# Patient Record
Sex: Female | Born: 1960 | Race: Black or African American | Hispanic: No | Marital: Single | Smoking: Never smoker
Health system: Southern US, Community
[De-identification: ages and names within clinical notes are randomized; demographics above are authoritative.]

## PROBLEM LIST (undated history)

## (undated) DIAGNOSIS — F419 Anxiety disorder, unspecified: Secondary | ICD-10-CM

## (undated) DIAGNOSIS — G43909 Migraine, unspecified, not intractable, without status migrainosus: Secondary | ICD-10-CM

## (undated) DIAGNOSIS — Z923 Personal history of irradiation: Secondary | ICD-10-CM

## (undated) DIAGNOSIS — C50212 Malignant neoplasm of upper-inner quadrant of left female breast: Principal | ICD-10-CM

## (undated) DIAGNOSIS — C50919 Malignant neoplasm of unspecified site of unspecified female breast: Secondary | ICD-10-CM

## (undated) HISTORY — PX: NO PAST SURGERIES: SHX2092

## (undated) HISTORY — DX: Anxiety disorder, unspecified: F41.9

## (undated) HISTORY — DX: Malignant neoplasm of upper-inner quadrant of left female breast: C50.212

## (undated) HISTORY — DX: Malignant neoplasm of unspecified site of unspecified female breast: C50.919

## (undated) HISTORY — DX: Migraine, unspecified, not intractable, without status migrainosus: G43.909

---

## 1997-08-22 ENCOUNTER — Other Ambulatory Visit: Admission: RE | Admit: 1997-08-22 | Discharge: 1997-08-22 | Payer: Self-pay | Admitting: Obstetrics and Gynecology

## 1999-10-15 ENCOUNTER — Other Ambulatory Visit: Admission: RE | Admit: 1999-10-15 | Discharge: 1999-10-15 | Payer: Self-pay | Admitting: Obstetrics and Gynecology

## 2001-01-26 ENCOUNTER — Other Ambulatory Visit: Admission: RE | Admit: 2001-01-26 | Discharge: 2001-01-26 | Payer: Self-pay | Admitting: Obstetrics and Gynecology

## 2002-06-28 ENCOUNTER — Other Ambulatory Visit: Admission: RE | Admit: 2002-06-28 | Discharge: 2002-06-28 | Payer: Self-pay | Admitting: Obstetrics & Gynecology

## 2003-07-25 ENCOUNTER — Other Ambulatory Visit: Admission: RE | Admit: 2003-07-25 | Discharge: 2003-07-25 | Payer: Self-pay | Admitting: Obstetrics and Gynecology

## 2004-07-30 ENCOUNTER — Other Ambulatory Visit: Admission: RE | Admit: 2004-07-30 | Discharge: 2004-07-30 | Payer: Self-pay | Admitting: Obstetrics and Gynecology

## 2005-08-19 ENCOUNTER — Other Ambulatory Visit: Admission: RE | Admit: 2005-08-19 | Discharge: 2005-08-19 | Payer: Self-pay | Admitting: Obstetrics and Gynecology

## 2009-03-31 ENCOUNTER — Emergency Department (HOSPITAL_COMMUNITY): Admission: EM | Admit: 2009-03-31 | Discharge: 2009-04-01 | Payer: Self-pay | Admitting: Internal Medicine

## 2014-08-02 ENCOUNTER — Encounter: Payer: Self-pay | Admitting: Internal Medicine

## 2014-08-02 ENCOUNTER — Other Ambulatory Visit (INDEPENDENT_AMBULATORY_CARE_PROVIDER_SITE_OTHER): Payer: BLUE CROSS/BLUE SHIELD

## 2014-08-02 ENCOUNTER — Ambulatory Visit (INDEPENDENT_AMBULATORY_CARE_PROVIDER_SITE_OTHER): Payer: BLUE CROSS/BLUE SHIELD | Admitting: Internal Medicine

## 2014-08-02 VITALS — BP 126/88 | HR 71 | Temp 98.7°F | Resp 12 | Ht 60.0 in | Wt 261.1 lb

## 2014-08-02 DIAGNOSIS — Z0000 Encounter for general adult medical examination without abnormal findings: Secondary | ICD-10-CM

## 2014-08-02 LAB — LIPID PANEL
Cholesterol: 202 mg/dL — ABNORMAL HIGH (ref 0–200)
HDL: 55.8 mg/dL (ref >39.00)
LDL Cholesterol: 129 mg/dL — ABNORMAL HIGH (ref 0–99)
NonHDL: 146.2
Total CHOL/HDL Ratio: 4
Triglycerides: 85 mg/dL (ref 0.0–149.0)
VLDL: 17 mg/dL (ref 0.0–40.0)

## 2014-08-02 LAB — HEMOGLOBIN A1C: Hgb A1c MFr Bld: 5.7 % (ref 4.6–6.5)

## 2014-08-02 LAB — COMPREHENSIVE METABOLIC PANEL
ALT: 12 U/L (ref 0–35)
AST: 16 U/L (ref 0–37)
Albumin: 3.7 g/dL (ref 3.5–5.2)
Alkaline Phosphatase: 63 U/L (ref 39–117)
BUN: 12 mg/dL (ref 6–23)
CO2: 29 mEq/L (ref 19–32)
Calcium: 9.5 mg/dL (ref 8.4–10.5)
Chloride: 106 mEq/L (ref 96–112)
Creatinine, Ser: 0.71 mg/dL (ref 0.40–1.20)
GFR: 110.23 mL/min (ref >60.00)
Glucose, Bld: 76 mg/dL (ref 70–99)
Potassium: 4 mEq/L (ref 3.5–5.1)
Sodium: 138 mEq/L (ref 135–145)
Total Bilirubin: 0.3 mg/dL (ref 0.2–1.2)
Total Protein: 7.2 g/dL (ref 6.0–8.3)

## 2014-08-02 LAB — CBC
HCT: 41.9 % (ref 36.0–46.0)
Hemoglobin: 13.8 g/dL (ref 12.0–15.0)
MCHC: 32.9 g/dL (ref 30.0–36.0)
MCV: 87.3 fl (ref 78.0–100.0)
Platelets: 375 10*3/uL (ref 150.0–400.0)
RBC: 4.8 Mil/uL (ref 3.87–5.11)
RDW: 14.1 % (ref 11.5–15.5)
WBC: 5 10*3/uL (ref 4.0–10.5)

## 2014-08-02 NOTE — Progress Notes (Signed)
Pre visit review using our clinic review tool, if applicable. No additional management support is needed unless otherwise documented below in the visit note. 

## 2014-08-02 NOTE — Patient Instructions (Signed)
We will check the blood work today and call you back with the results.   We will send a message upstairs to the GI office to help get you in with the colon cancer screening.   Work on exercising and eating healthy for you to lose some weight. This is something that will keep you healthy and feeling good for years to come.   Health Maintenance Adopting a healthy lifestyle and getting preventive care can go a long way to promote health and wellness. Talk with your health care provider about what schedule of regular examinations is right for you. This is a good chance for you to check in with your provider about disease prevention and staying healthy. In between checkups, there are plenty of things you can do on your own. Experts have done a lot of research about which lifestyle changes and preventive measures are most likely to keep you healthy. Ask your health care provider for more information. WEIGHT AND DIET  Eat a healthy diet  Be sure to include plenty of vegetables, fruits, low-fat dairy products, and lean protein.  Do not eat a lot of foods high in solid fats, added sugars, or salt.  Get regular exercise. This is one of the most important things you can do for your health.  Most adults should exercise for at least 150 minutes each week. The exercise should increase your heart rate and make you sweat (moderate-intensity exercise).  Most adults should also do strengthening exercises at least twice a week. This is in addition to the moderate-intensity exercise.  Maintain a healthy weight  Body mass index (BMI) is a measurement that can be used to identify possible weight problems. It estimates body fat based on height and weight. Your health care provider can help determine your BMI and help you achieve or maintain a healthy weight.  For females 56 years of age and older:   A BMI below 18.5 is considered underweight.  A BMI of 18.5 to 24.9 is normal.  A BMI of 25 to 29.9 is  considered overweight.  A BMI of 30 and above is considered obese.  Watch levels of cholesterol and blood lipids  You should start having your blood tested for lipids and cholesterol at 54 years of age, then have this test every 5 years.  You may need to have your cholesterol levels checked more often if:  Your lipid or cholesterol levels are high.  You are older than 54 years of age.  You are at high risk for heart disease.  CANCER SCREENING   Lung Cancer  Lung cancer screening is recommended for adults 57-33 years old who are at high risk for lung cancer because of a history of smoking.  A yearly low-dose CT scan of the lungs is recommended for people who:  Currently smoke.  Have quit within the past 15 years.  Have at least a 30-pack-year history of smoking. A pack year is smoking an average of one pack of cigarettes a day for 1 year.  Yearly screening should continue until it has been 15 years since you quit.  Yearly screening should stop if you develop a health problem that would prevent you from having lung cancer treatment.  Breast Cancer  Practice breast self-awareness. This means understanding how your breasts normally appear and feel.  It also means doing regular breast self-exams. Let your health care provider know about any changes, no matter how small.  If you are in your 20s or 30s,  you should have a clinical breast exam (CBE) by a health care provider every 1-3 years as part of a regular health exam.  If you are 26 or older, have a CBE every year. Also consider having a breast X-ray (mammogram) every year.  If you have a family history of breast cancer, talk to your health care provider about genetic screening.  If you are at high risk for breast cancer, talk to your health care provider about having an MRI and a mammogram every year.  Breast cancer gene (BRCA) assessment is recommended for women who have family members with BRCA-related cancers.  BRCA-related cancers include:  Breast.  Ovarian.  Tubal.  Peritoneal cancers.  Results of the assessment will determine the need for genetic counseling and BRCA1 and BRCA2 testing. Cervical Cancer Routine pelvic examinations to screen for cervical cancer are no longer recommended for nonpregnant women who are considered low risk for cancer of the pelvic organs (ovaries, uterus, and vagina) and who do not have symptoms. A pelvic examination may be necessary if you have symptoms including those associated with pelvic infections. Ask your health care provider if a screening pelvic exam is right for you.   The Pap test is the screening test for cervical cancer for women who are considered at risk.  If you had a hysterectomy for a problem that was not cancer or a condition that could lead to cancer, then you no longer need Pap tests.  If you are older than 65 years, and you have had normal Pap tests for the past 10 years, you no longer need to have Pap tests.  If you have had past treatment for cervical cancer or a condition that could lead to cancer, you need Pap tests and screening for cancer for at least 20 years after your treatment.  If you no longer get a Pap test, assess your risk factors if they change (such as having a new sexual partner). This can affect whether you should start being screened again.  Some women have medical problems that increase their chance of getting cervical cancer. If this is the case for you, your health care provider may recommend more frequent screening and Pap tests.  The human papillomavirus (HPV) test is another test that may be used for cervical cancer screening. The HPV test looks for the virus that can cause cell changes in the cervix. The cells collected during the Pap test can be tested for HPV.  The HPV test can be used to screen women 57 years of age and older. Getting tested for HPV can extend the interval between normal Pap tests from three to  five years.  An HPV test also should be used to screen women of any age who have unclear Pap test results.  After 54 years of age, women should have HPV testing as often as Pap tests.  Colorectal Cancer  This type of cancer can be detected and often prevented.  Routine colorectal cancer screening usually begins at 55 years of age and continues through 54 years of age.  Your health care provider may recommend screening at an earlier age if you have risk factors for colon cancer.  Your health care provider may also recommend using home test kits to check for hidden blood in the stool.  A small camera at the end of a tube can be used to examine your colon directly (sigmoidoscopy or colonoscopy). This is done to check for the earliest forms of colorectal cancer.  Routine  screening usually begins at age 87.  Direct examination of the colon should be repeated every 5-10 years through 54 years of age. However, you may need to be screened more often if early forms of precancerous polyps or small growths are found. Skin Cancer  Check your skin from head to toe regularly.  Tell your health care provider about any new moles or changes in moles, especially if there is a change in a mole's shape or color.  Also tell your health care provider if you have a mole that is larger than the size of a pencil eraser.  Always use sunscreen. Apply sunscreen liberally and repeatedly throughout the day.  Protect yourself by wearing long sleeves, pants, a wide-brimmed hat, and sunglasses whenever you are outside. HEART DISEASE, DIABETES, AND HIGH BLOOD PRESSURE   Have your blood pressure checked at least every 1-2 years. High blood pressure causes heart disease and increases the risk of stroke.  If you are between 12 years and 86 years old, ask your health care provider if you should take aspirin to prevent strokes.  Have regular diabetes screenings. This involves taking a blood sample to check your  fasting blood sugar level.  If you are at a normal weight and have a low risk for diabetes, have this test once every three years after 54 years of age.  If you are overweight and have a high risk for diabetes, consider being tested at a younger age or more often. PREVENTING INFECTION  Hepatitis B  If you have a higher risk for hepatitis B, you should be screened for this virus. You are considered at high risk for hepatitis B if:  You were born in a country where hepatitis B is common. Ask your health care provider which countries are considered high risk.  Your parents were born in a high-risk country, and you have not been immunized against hepatitis B (hepatitis B vaccine).  You have HIV or AIDS.  You use needles to inject street drugs.  You live with someone who has hepatitis B.  You have had sex with someone who has hepatitis B.  You get hemodialysis treatment.  You take certain medicines for conditions, including cancer, organ transplantation, and autoimmune conditions. Hepatitis C  Blood testing is recommended for:  Everyone born from [birth year] through 1965.  Anyone with known risk factors for hepatitis C. Sexually transmitted infections (STIs)  You should be screened for sexually transmitted infections (STIs) including gonorrhea and chlamydia if:  You are sexually active and are younger than 54 years of age.  You are older than 54 years of age and your health care provider tells you that you are at risk for this type of infection.  Your sexual activity has changed since you were last screened and you are at an increased risk for chlamydia or gonorrhea. Ask your health care provider if you are at risk.  If you do not have HIV, but are at risk, it may be recommended that you take a prescription medicine daily to prevent HIV infection. This is called pre-exposure prophylaxis (PrEP). You are considered at risk if:  You are sexually active and do not regularly use condoms or  know the HIV status of your partner(s).  You take drugs by injection.  You are sexually active with a partner who has HIV. Talk with your health care provider about whether you are at high risk of being infected with HIV. If you choose to begin PrEP, you should first be  tested for HIV. You should then be tested every 3 months for as long as you are taking PrEP.  PREGNANCY   If you are premenopausal and you may become pregnant, ask your health care provider about preconception counseling.  If you may become pregnant, take 400 to 800 micrograms (mcg) of folic acid every day.  If you want to prevent pregnancy, talk to your health care provider about birth control (contraception). OSTEOPOROSIS AND MENOPAUSE   Osteoporosis is a disease in which the bones lose minerals and strength with aging. This can result in serious bone fractures. Your risk for osteoporosis can be identified using a bone density scan.  If you are 72 years of age or older, or if you are at risk for osteoporosis and fractures, ask your health care provider if you should be screened.  Ask your health care provider whether you should take a calcium or vitamin D supplement to lower your risk for osteoporosis.  Menopause may have certain physical symptoms and risks.  Hormone replacement therapy may reduce some of these symptoms and risks. Talk to your health care provider about whether hormone replacement therapy is right for you.  HOME CARE INSTRUCTIONS   Schedule regular health, dental, and eye exams.  Stay current with your immunizations.   Do not use any tobacco products including cigarettes, chewing tobacco, or electronic cigarettes.  If you are pregnant, do not drink alcohol.  If you are breastfeeding, limit how much and how often you drink alcohol.  Limit alcohol intake to no more than 1 drink per day for nonpregnant women. One drink equals 12 ounces of beer, 5 ounces of wine, or 1 ounces of hard liquor.  Do  not use street drugs.  Do not share needles.  Ask your health care provider for help if you need support or information about quitting drugs.  Tell your health care provider if you often feel depressed.  Tell your health care provider if you have ever been abused or do not feel safe at home. Document Released: 11/04/2010 Document Revised: 09/05/2013 Document Reviewed: 03/23/2013 [facility] Patient Information 2015 [place], [place]. This information is not intended to replace advice given to you by your health care provider. Make sure you discuss any questions you have with your health care provider.

## 2014-08-03 ENCOUNTER — Encounter: Payer: Self-pay | Admitting: Internal Medicine

## 2014-08-03 NOTE — Progress Notes (Signed)
   Subjective:    Patient ID: Maria Allen, female    DOB: November 22, 1960, 54 y.o.   MRN: [id number]  HPI The patient is a new 54 YO female who is coming in for her weight. She has struggled with her for most of her life and has gone down a little bit recently with dieting and more walking. She is not able to do heavy exercise because of her joints but does work to walk regularly. She would like some tips on food. There are weight problems in her family and her mom's side has strong history of diabetes. It has been many years since she saw a doctor and wants to be tested.   PMH, [facility], social history reviewed and updated during visit.   Review of Systems  Constitutional: Negative for fever, activity change, appetite change, fatigue and unexpected weight change.  HENT: Negative.   Eyes: Negative.   Respiratory: Negative for chest tightness, shortness of breath and wheezing.   Cardiovascular: Negative.   Gastrointestinal: Negative.   Musculoskeletal: Negative.   Skin: Negative.   Neurological: Negative.   Psychiatric/Behavioral: Negative.       Objective:   Physical Exam  Constitutional: She is oriented to person, place, and time. She appears well-developed and well-nourished.  HENT:  Head: Normocephalic and atraumatic.  Eyes: EOM are normal.  Neck: Normal range of motion.  Cardiovascular: Normal rate and regular rhythm.   Pulmonary/Chest: Effort normal and breath sounds normal. No respiratory distress. She has no wheezes. She has no rales.  Abdominal: Soft. Bowel sounds are normal. She exhibits no distension. There is no tenderness.  Musculoskeletal: She exhibits no edema.  Neurological: She is alert and oriented to person, place, and time. Coordination normal.  Skin: Skin is warm and dry.   Filed Vitals:   08/02/14 0853  BP: 126/88  Pulse: 71  Temp: 98.7 F (37.1 C)  TempSrc: Oral  Resp: 12  Height: 5' (1.524 m)  Weight: 261 lb 1.9 oz (118.443 kg)  SpO2: 97%      Assessment  & Plan:

## 2014-08-03 NOTE — Assessment & Plan Note (Signed)
She is working on losing weight and we do discuss diet and serving size during our visit. Discussed going to see a nutritionist and she declines but will call back if her diet is not going well. Checking labs today including lipid panel, HgA1c. BP okay.

## 2014-08-04 ENCOUNTER — Telehealth: Payer: Self-pay

## 2014-08-04 NOTE — Telephone Encounter (Signed)
-----   Message from Olga Millers, MD sent at 08/04/2014 11:40 AM EDT ----- Please call and let her know that no signs of diabetes, kidneys and liver are normal as well as her blood counts.

## 2014-08-04 NOTE — Telephone Encounter (Signed)
Left pt a voice mail of dr Donneta Romberg findings, and to call office is she has any further questions

## 2014-09-25 ENCOUNTER — Telehealth: Payer: Self-pay | Admitting: *Deleted

## 2014-09-25 ENCOUNTER — Other Ambulatory Visit: Payer: Self-pay | Admitting: *Deleted

## 2014-09-25 ENCOUNTER — Telehealth: Payer: Self-pay | Admitting: Internal Medicine

## 2014-09-25 DIAGNOSIS — Z1211 Encounter for screening for malignant neoplasm of colon: Secondary | ICD-10-CM

## 2014-09-25 NOTE — Telephone Encounter (Signed)
Patient has now been rescheduled for Monday, 11/13/14 @ 9:30 am [facility] end[facility] with at 8:00 am arrival.

## 2014-09-25 NOTE — Telephone Encounter (Signed)
Patient has been rescheduled to [facility] for colonoscopy. Procedure is scheduled for Tuesday, 10/17/14 @ 10:30 am. She will need to arrive at hospital 9:00 am.

## 2014-09-25 NOTE — Telephone Encounter (Signed)
Called to inform patient that her procedure had to be scheduled at [facility] and had been moved to Tuesday June 14. She is unable to schedule an appointment for any other day than Monday. Dr. Hilarie Fredrickson please advise.

## 2014-09-25 NOTE — Telephone Encounter (Signed)
Maria Allen wants Monday Okay for Monday 1/2 day or Monday of next hospital week assuming MAC available.  If hospital week then 9am or later

## 2014-09-25 NOTE — Telephone Encounter (Signed)
Pt has a BMI of 51, is patient okay for direct hospital colonoscopy or this patient need an office visit before scheduling colonoscopy?

## 2014-09-25 NOTE — Telephone Encounter (Signed)
[name] for direct. I have included Maria Allen please schedule for outpatient 1/2 day session (avoid hospital week if able)

## 2014-09-26 NOTE — Telephone Encounter (Signed)
Returned call to patient, patient initially thought the 11/13/2014 date would not work with her schedule but as of 09/26/2014, she states that 11/13/2014 does work and she plans on making that appointment work.

## 2014-10-03 ENCOUNTER — Ambulatory Visit (AMBULATORY_SURGERY_CENTER): Payer: Self-pay | Admitting: *Deleted

## 2014-10-03 VITALS — Ht 60.0 in | Wt 257.0 lb

## 2014-10-03 DIAGNOSIS — Z1211 Encounter for screening for malignant neoplasm of colon: Secondary | ICD-10-CM

## 2014-10-03 NOTE — Progress Notes (Signed)
No egg or soy allergy No diet pills No home 02 use Never had sedation in the past  emmi video to e mail

## 2014-10-16 ENCOUNTER — Encounter: Payer: BLUE CROSS/BLUE SHIELD | Admitting: Internal Medicine

## 2014-11-02 ENCOUNTER — Encounter (HOSPITAL_COMMUNITY): Payer: Self-pay | Admitting: *Deleted

## 2014-11-03 ENCOUNTER — Telehealth: Payer: Self-pay

## 2014-11-03 NOTE — Telephone Encounter (Signed)
Called patient to see if a recent mammogram has been done. Per patient recent mammogram done through Dr. Cheral Bay with [facility], will call office to obtain report.

## 2014-11-13 ENCOUNTER — Ambulatory Visit (HOSPITAL_COMMUNITY)
Admission: RE | Admit: 2014-11-13 | Discharge: 2014-11-13 | Disposition: A | Discharge status: Home / Self Care | Payer: BLUE CROSS/BLUE SHIELD | Source: Ambulatory Visit | Attending: Internal Medicine | Admitting: Internal Medicine

## 2014-11-13 ENCOUNTER — Encounter (HOSPITAL_COMMUNITY): Payer: Self-pay

## 2014-11-13 ENCOUNTER — Encounter (HOSPITAL_COMMUNITY)
Admission: RE | Disposition: A | Discharge status: Home / Self Care | Payer: Self-pay | Source: Ambulatory Visit | Attending: Internal Medicine

## 2014-11-13 ENCOUNTER — Ambulatory Visit (HOSPITAL_COMMUNITY): Payer: BLUE CROSS/BLUE SHIELD | Admitting: Anesthesiology

## 2014-11-13 DIAGNOSIS — Z6841 Body Mass Index (BMI) 40.0 and over, adult: Secondary | ICD-10-CM | POA: Diagnosis not present

## 2014-11-13 DIAGNOSIS — Z1211 Encounter for screening for malignant neoplasm of colon: Secondary | ICD-10-CM | POA: Insufficient documentation

## 2014-11-13 HISTORY — PX: COLONOSCOPY WITH PROPOFOL: SHX5780

## 2014-11-13 SURGERY — COLONOSCOPY WITH PROPOFOL
Anesthesia: Monitor Anesthesia Care

## 2014-11-13 MED ORDER — PROPOFOL 10 MG/ML IV BOLUS
INTRAVENOUS | Status: AC
  Filled 2014-11-13: qty 20

## 2014-11-13 MED ORDER — SODIUM CHLORIDE 0.9 % IV SOLN: INTRAVENOUS | Status: DC

## 2014-11-13 MED ORDER — PROPOFOL INFUSION 10 MG/ML OPTIME
INTRAVENOUS | Status: DC | PRN
  Administered 2014-11-13: 300 ug/kg/min via INTRAVENOUS

## 2014-11-13 MED ORDER — LACTATED RINGERS IV SOLN
INTRAVENOUS | Status: DC
  Administered 2014-11-13: 1000 mL via INTRAVENOUS
  Administered 2014-11-13: 09:00:00 via INTRAVENOUS

## 2014-11-13 SURGICAL SUPPLY — 22 items

## 2014-11-13 NOTE — Op Note (Signed)
[facility] [name], [address]   COLONOSCOPY PROCEDURE REPORT  PATIENT: Maria Allen, Maria Allen  MR#: [id number] BIRTHDATE: 09/29/1960 , 85  yrs. old GENDER: female ENDOSCOPIST: Jerene Bears, MD REFERRED WE:[id number] Doug Sou, M.D. PROCEDURE DATE:  11/13/2014 PROCEDURE:   Colonoscopy, screening First Screening Colonoscopy - Avg.  risk and is 50 yrs.  old or older Yes.  Prior Negative Screening - Now for repeat screening. N/A  History of Adenoma - Now for follow-up colonoscopy & has been > or = to 3 yrs.  N/A  Polyps removed today? No Recommend repeat exam, <10 yrs? No ASA CLASS:   Class III INDICATIONS:Screening for colonic neoplasia and Colorectal Neoplasm Risk Assessment for this procedure is average risk. MEDICATIONS: Monitored anesthesia care and Per Anesthesia  DESCRIPTION OF PROCEDURE:   After the risks benefits and alternatives of the procedure were thoroughly explained, informed consent was obtained.  The digital rectal exam revealed skin tag and no masses.   The Pentax adult colonoscope was introduced through the anus and advanced to the cecum, which was identified by both the appendix and ileocecal valve. No adverse events experienced.   The quality of the prep was good.  (Suprep was used) The instrument was then slowly withdrawn as the colon was fully examined. Estimated blood loss is zero unless otherwise noted in this procedure report.      COLON FINDINGS: A normal appearing cecum, ileocecal valve, and appendiceal orifice were identified.  the ascending, transverse, descending, sigmoid colon, and rectum appeared unremarkable. Retroflexed views revealed no abnormalities. The time to cecum = 2.6 Withdrawal time = 9.2   The scope was withdrawn and the procedure completed.  COMPLICATIONS: There were no complications.  ENDOSCOPIC IMPRESSION: Normal colonoscopy  RECOMMENDATIONS: You should continue to follow colorectal cancer screening  guidelines for "routine risk" patients with a repeat colonoscopy in 10 years. There is no need for FOBT (stool) testing for at least 5 years.  eSigned:  Jerene Bears, MD 11/13/2014 10:12 AM   cc:  the patient, Dr. Doug Sou

## 2014-11-13 NOTE — H&P (Signed)
HPI: Maria Allen is a 54 year old female with past medical history of migraine headaches who presents for outpatient screening colonoscopy. She is average risk. Never had colonoscopy before. Bowel habits regular prior to getting the prep. Did have some nausea and vomiting with the prep but feels that stools are clear. Denies abdominal pain, blood in stool and melena. No family history of colon cancer  Past Medical History  Diagnosis Date  . Migraine     Past Surgical History  Procedure Laterality Date  . No past surgeries       (Not in an outpatient encounter)  No Known Allergies  Family History  Problem Relation Age of Onset  . Diabetes Mother   . Alcohol abuse Father   . Stroke Father   . Mental illness Father   . Colon cancer Neg Hx   . Colon polyps Neg Hx   . Rectal cancer Neg Hx   . Stomach cancer Neg Hx     History  Substance Use Topics  . Smoking status: Never Smoker   . Smokeless tobacco: Never Used  . Alcohol Use: No    ROS: As per history of present illness, otherwise negative  BP 140/84 mmHg  Pulse 72  Temp(Src) 98.4 F (36.9 C) (Oral)  Resp 18  Ht 5' (1.524 m)  SpO2 98% Constitutional: Well-developed and well-nourished. No distress. HEENT: Normocephalic and atraumatic. Conjunctivae are normal.  No scleral icterus. Neck: Neck supple. Trachea midline. Cardiovascular: Normal rate, regular rhythm and intact distal pulses. No M/R/G Pulmonary/chest: Effort normal and breath sounds normal. No wheezing, rales or rhonchi. Abdominal: Soft, nontender, nondistended. Bowel sounds active throughout. Obese. Extremities: no clubbing, cyanosis, or edema Neurological: Alert and oriented to person place and time. Psychiatric: Normal mood and affect. Behavior is normal.  RELEVANT LABS AND IMAGING: CBC    Component Value Date/Time   WBC 5.0 08/02/2014 0929   RBC 4.80 08/02/2014 0929   HGB 13.8 08/02/2014 0929   HCT 41.9 08/02/2014 0929   PLT 375.0 08/02/2014  0929   MCV 87.3 08/02/2014 0929   MCHC 32.9 08/02/2014 0929   RDW 14.1 08/02/2014 0929    CMP     Component Value Date/Time   NA 138 08/02/2014 0929   K 4.0 08/02/2014 0929   CL 106 08/02/2014 0929   CO2 29 08/02/2014 0929   GLUCOSE 76 08/02/2014 0929   BUN 12 08/02/2014 0929   CREATININE 0.71 08/02/2014 0929   CALCIUM 9.5 08/02/2014 0929   PROT 7.2 08/02/2014 0929   ALBUMIN 3.7 08/02/2014 0929   AST 16 08/02/2014 0929   ALT 12 08/02/2014 0929   ALKPHOS 63 08/02/2014 0929   BILITOT 0.3 08/02/2014 0929    ASSESSMENT/PLAN: 54 year old female presented for average risk screening colonoscopy  1. CRC screening -- colonoscopy being performed in the outpatient hospital setting due to BMI greater than 50. The nature of the procedure, as well as the risks, benefits, and alternatives were carefully and thoroughly reviewed with the patient. Ample time for discussion and questions allowed. The patient understood, was satisfied, and agreed to proceed.

## 2014-11-13 NOTE — Anesthesia Procedure Notes (Signed)
Procedure Name: MAC Date/Time: 11/13/2014 9:44 AM Performed by: Dione Booze Pre-anesthesia Checklist: Patient identified, Emergency Drugs available, Suction available and Patient being monitored Patient Re-evaluated:Patient Re-evaluated prior to inductionOxygen Delivery Method: Simple face mask Placement Confirmation: positive ETCO2 Dental Injury: Teeth and Oropharynx as per pre-operative assessment

## 2014-11-13 NOTE — Anesthesia Postprocedure Evaluation (Signed)
  Anesthesia Post-op Note  Patient: Maria Allen  Procedure(s) Performed: Procedure(s) (LRB): COLONOSCOPY WITH PROPOFOL (N/A)  Patient Location: PACU  Anesthesia Type: MAC  Level of Consciousness: awake and alert   Airway and Oxygen Therapy: Patient Spontanous Breathing  Post-op Pain: mild  Post-op Assessment: Post-op Vital signs reviewed, Patient's Cardiovascular Status Stable, Respiratory Function Stable, Patent Airway and No signs of Nausea or vomiting  Last Vitals:  Filed Vitals:   11/13/14 1040  BP: 124/79  Pulse: 71  Temp:   Resp: 10    Post-op Vital Signs: stable   Complications: No apparent anesthesia complications

## 2014-11-13 NOTE — Transfer of Care (Signed)
Immediate Anesthesia Transfer of Care Note  Patient: Maria Allen  Procedure(s) Performed: Procedure(s): COLONOSCOPY WITH PROPOFOL (N/A)  Patient Location: PACU and Endoscopy Unit  Anesthesia Type:MAC  Level of Consciousness: sedated and patient cooperative  Airway & Oxygen Therapy: Patient Spontanous Breathing and Patient connected to face mask oxygen  Post-op Assessment: Report given to RN and Post -op Vital signs reviewed and stable  Post vital signs: Reviewed and stable  Last Vitals:  Filed Vitals:   11/13/14 0903  BP: 140/84  Pulse: 72  Temp: 36.9 C  Resp: 18    Complications: No apparent anesthesia complications

## 2014-11-13 NOTE — Discharge Instructions (Signed)

## 2014-11-13 NOTE — Anesthesia Preprocedure Evaluation (Signed)
Anesthesia Evaluation  Patient identified by MRN, date of birth, ID band Patient awake    Reviewed: Allergy & Precautions, NPO status , Patient's Chart, lab work & pertinent test results  Airway Mallampati: II  TM Distance: >3 FB Neck ROM: Full    Dental no notable dental hx.    Pulmonary neg pulmonary ROS,  breath sounds clear to auscultation  Pulmonary exam normal       Cardiovascular negative cardio ROS Normal cardiovascular examRhythm:Regular Rate:Normal     Neuro/Psych negative neurological ROS  negative psych ROS   GI/Hepatic negative GI ROS, Neg liver ROS,   Endo/Other  Morbid obesity  Renal/GU negative Renal ROS  negative genitourinary   Musculoskeletal negative musculoskeletal ROS (+)   Abdominal   Peds negative pediatric ROS (+)  Hematology negative hematology ROS (+)   Anesthesia Other Findings   Reproductive/Obstetrics negative OB ROS                             Anesthesia Physical Anesthesia Plan  ASA: III  Anesthesia Plan: MAC   Post-op Pain Management:    Induction:   Airway Management Planned: Natural Airway and Simple Face Mask  Additional Equipment:   Intra-op Plan:   Post-operative Plan:   Informed Consent: I have reviewed the patients History and Physical, chart, labs and discussed the procedure including the risks, benefits and alternatives for the proposed anesthesia with the patient or authorized representative who has indicated his/her understanding and acceptance.   Dental advisory given  Plan Discussed with: CRNA  Anesthesia Plan Comments:         Anesthesia Quick Evaluation

## 2014-11-14 ENCOUNTER — Encounter (HOSPITAL_COMMUNITY): Payer: Self-pay | Admitting: Internal Medicine

## 2015-08-06 ENCOUNTER — Encounter: Payer: BLUE CROSS/BLUE SHIELD | Admitting: Internal Medicine

## 2015-08-10 ENCOUNTER — Other Ambulatory Visit: Payer: Self-pay | Admitting: Obstetrics & Gynecology

## 2015-08-10 DIAGNOSIS — R928 Other abnormal and inconclusive findings on diagnostic imaging of breast: Secondary | ICD-10-CM

## 2015-08-13 ENCOUNTER — Encounter: Payer: Self-pay | Admitting: Internal Medicine

## 2015-08-13 ENCOUNTER — Ambulatory Visit (INDEPENDENT_AMBULATORY_CARE_PROVIDER_SITE_OTHER): Payer: BLUE CROSS/BLUE SHIELD | Admitting: Internal Medicine

## 2015-08-13 VITALS — BP 132/84 | HR 94 | Temp 98.4°F | Resp 12 | Ht 60.0 in | Wt 267.0 lb

## 2015-08-13 DIAGNOSIS — Z0000 Encounter for general adult medical examination without abnormal findings: Secondary | ICD-10-CM

## 2015-08-13 NOTE — Progress Notes (Signed)
   Subjective:    Patient ID: Maria Allen, female    DOB: [birth year], 55 y.o.   MRN: [id number]  HPI The patient is a 55 YO female coming in for wellness. No new concerns, and just had labs at her gynecologist. They started her on phentermine which she started about 1 week ago. Trying to work out in the yard to get back into exercise.   PMH, [facility], social history reviewed and updated.   Review of Systems  Constitutional: Negative for fever, activity change, appetite change, fatigue and unexpected weight change.  HENT: Negative.   Eyes: Negative.   Respiratory: Negative for chest tightness, shortness of breath and wheezing.   Cardiovascular: Negative.   Gastrointestinal: Negative.   Musculoskeletal: Negative.   Skin: Negative.   Neurological: Negative.   Psychiatric/Behavioral: Negative.       Objective:   Physical Exam  Constitutional: She is oriented to person, place, and time. She appears well-developed and well-nourished.  Overweight  HENT:  Head: Normocephalic and atraumatic.  Eyes: EOM are normal.  Neck: Normal range of motion.  Cardiovascular: Normal rate and regular rhythm.   Pulmonary/Chest: Effort normal and breath sounds normal. No respiratory distress. She has no wheezes. She has no rales.  Abdominal: Soft. Bowel sounds are normal. She exhibits no distension. There is no tenderness.  Musculoskeletal: She exhibits no edema.  Neurological: She is alert and oriented to person, place, and time. Coordination normal.  Skin: Skin is warm and dry.   Filed Vitals:   08/13/15 1533  BP: 132/84  Pulse: 94  Temp: 98.4 F (36.9 C)  TempSrc: Oral  Resp: 12  Height: 5' (1.524 m)  Weight: 267 lb (121.11 kg)  SpO2: 97%      Assessment & Plan:

## 2015-08-13 NOTE — Assessment & Plan Note (Signed)
Will get labs from gynecology, talked to her about exercise for weight loss. She is good about nutrition and does not want to see dietician. Up to date on colonoscopy (due 2026) and mammogram (getting [place] next week for likely cyst).

## 2015-08-13 NOTE — Patient Instructions (Signed)
We do not need to check any blood work today.   Keep up the good work with exercise to keep you healthy.   Exercising to lose weight we recommend 1 hour a day 6 days per week.   Health Maintenance, Female Adopting a healthy lifestyle and getting preventive care can go a long way to promote health and wellness. Talk with your health care provider about what schedule of regular examinations is right for you. This is a good chance for you to check in with your provider about disease prevention and staying healthy. In between checkups, there are plenty of things you can do on your own. Experts have done a lot of research about which lifestyle changes and preventive measures are most likely to keep you healthy. Ask your health care provider for more information. WEIGHT AND DIET  Eat a healthy diet  Be sure to include plenty of vegetables, fruits, low-fat dairy products, and lean protein.  Do not eat a lot of foods high in solid fats, added sugars, or salt.  Get regular exercise. This is one of the most important things you can do for your health.  Most adults should exercise for at least 150 minutes each week. The exercise should increase your heart rate and make you sweat (moderate-intensity exercise).  Most adults should also do strengthening exercises at least twice a week. This is in addition to the moderate-intensity exercise.  Maintain a healthy weight  Body mass index (BMI) is a measurement that can be used to identify possible weight problems. It estimates body fat based on height and weight. Your health care provider can help determine your BMI and help you achieve or maintain a healthy weight.  For females 31 years of age and older:   A BMI below 18.5 is considered underweight.  A BMI of 18.5 to 24.9 is normal.  A BMI of 25 to 29.9 is considered overweight.  A BMI of 30 and above is considered obese.  Watch levels of cholesterol and blood lipids  You should start having  your blood tested for lipids and cholesterol at 55 years of age, then have this test every 5 years.  You may need to have your cholesterol levels checked more often if:  Your lipid or cholesterol levels are high.  You are older than 55 years of age.  You are at high risk for heart disease.  CANCER SCREENING   Lung Cancer  Lung cancer screening is recommended for adults 53-34 years old who are at high risk for lung cancer because of a history of smoking.  A yearly low-dose CT scan of the lungs is recommended for people who:  Currently smoke.  Have quit within the past 15 years.  Have at least a 30-pack-year history of smoking. A pack year is smoking an average of one pack of cigarettes a day for 1 year.  Yearly screening should continue until it has been 15 years since you quit.  Yearly screening should stop if you develop a health problem that would prevent you from having lung cancer treatment.  Breast Cancer  Practice breast self-awareness. This means understanding how your breasts normally appear and feel.  It also means doing regular breast self-exams. Let your health care provider know about any changes, no matter how small.  If you are in your 20s or 30s, you should have a clinical breast exam (CBE) by a health care provider every 1-3 years as part of a regular health exam.  If  you are 40 or older, have a CBE every year. Also consider having a breast X-ray (mammogram) every year.  If you have a family history of breast cancer, talk to your health care provider about genetic screening.  If you are at high risk for breast cancer, talk to your health care provider about having an MRI and a mammogram every year.  Breast cancer gene (BRCA) assessment is recommended for women who have family members with BRCA-related cancers. BRCA-related cancers include:  Breast.  Ovarian.  Tubal.  Peritoneal cancers.  Results of the assessment will determine the need for genetic  counseling and BRCA1 and BRCA2 testing. Cervical Cancer Your health care provider may recommend that you be screened regularly for cancer of the pelvic organs (ovaries, uterus, and vagina). This screening involves a pelvic examination, including checking for microscopic changes to the surface of your cervix (Pap test). You may be encouraged to have this screening done every 3 years, beginning at age 13.  For women ages 32-65, health care providers may recommend pelvic exams and Pap testing every 3 years, or they may recommend the Pap and pelvic exam, combined with testing for human papilloma virus (HPV), every 5 years. Some types of HPV increase your risk of cervical cancer. Testing for HPV may also be done on women of any age with unclear Pap test results.  Other health care providers may not recommend any screening for nonpregnant women who are considered low risk for pelvic cancer and who do not have symptoms. Ask your health care provider if a screening pelvic exam is right for you.  If you have had past treatment for cervical cancer or a condition that could lead to cancer, you need Pap tests and screening for cancer for at least 20 years after your treatment. If Pap tests have been discontinued, your risk factors (such as having a new sexual partner) need to be reassessed to determine if screening should resume. Some women have medical problems that increase the chance of getting cervical cancer. In these cases, your health care provider may recommend more frequent screening and Pap tests. Colorectal Cancer  This type of cancer can be detected and often prevented.  Routine colorectal cancer screening usually begins at 55 years of age and continues through 55 years of age.  Your health care provider may recommend screening at an earlier age if you have risk factors for colon cancer.  Your health care provider may also recommend using home test kits to check for hidden blood in the stool.  A  small camera at the end of a tube can be used to examine your colon directly (sigmoidoscopy or colonoscopy). This is done to check for the earliest forms of colorectal cancer.  Routine screening usually begins at age 75.  Direct examination of the colon should be repeated every 5-10 years through 55 years of age. However, you may need to be screened more often if early forms of precancerous polyps or small growths are found. Skin Cancer  Check your skin from head to toe regularly.  Tell your health care provider about any new moles or changes in moles, especially if there is a change in a mole's shape or color.  Also tell your health care provider if you have a mole that is larger than the size of a pencil eraser.  Always use sunscreen. Apply sunscreen liberally and repeatedly throughout the day.  Protect yourself by wearing long sleeves, pants, a wide-brimmed hat, and sunglasses whenever you are  outside. HEART DISEASE, DIABETES, AND HIGH BLOOD PRESSURE   High blood pressure causes heart disease and increases the risk of stroke. High blood pressure is more likely to develop in:  People who have blood pressure in the high end of the normal range (130-139/85-89 mm Hg).  People who are overweight or obese.  People who are African American.  If you are 15-59 years of age, have your blood pressure checked every 3-5 years. If you are 11 years of age or older, have your blood pressure checked every year. You should have your blood pressure measured twice--once when you are at a hospital or clinic, and once when you are not at a hospital or clinic. Record the average of the two measurements. To check your blood pressure when you are not at a hospital or clinic, you can use:  An automated blood pressure machine at a pharmacy.  A home blood pressure monitor.  If you are between 57 years and 75 years old, ask your health care provider if you should take aspirin to prevent strokes.  Have  regular diabetes screenings. This involves taking a blood sample to check your fasting blood sugar level.  If you are at a normal weight and have a low risk for diabetes, have this test once every three years after 55 years of age.  If you are overweight and have a high risk for diabetes, consider being tested at a younger age or more often. PREVENTING INFECTION  Hepatitis B  If you have a higher risk for hepatitis B, you should be screened for this virus. You are considered at high risk for hepatitis B if:  You were born in a country where hepatitis B is common. Ask your health care provider which countries are considered high risk.  Your parents were born in a high-risk country, and you have not been immunized against hepatitis B (hepatitis B vaccine).  You have HIV or AIDS.  You use needles to inject street drugs.  You live with someone who has hepatitis B.  You have had sex with someone who has hepatitis B.  You get hemodialysis treatment.  You take certain medicines for conditions, including cancer, organ transplantation, and autoimmune conditions. Hepatitis C  Blood testing is recommended for:  Everyone born from [birth year] through 1965.  Anyone with known risk factors for hepatitis C. Sexually transmitted infections (STIs)  You should be screened for sexually transmitted infections (STIs) including gonorrhea and chlamydia if:  You are sexually active and are younger than 55 years of age.  You are older than 55 years of age and your health care provider tells you that you are at risk for this type of infection.  Your sexual activity has changed since you were last screened and you are at an increased risk for chlamydia or gonorrhea. Ask your health care provider if you are at risk.  If you do not have HIV, but are at risk, it may be recommended that you take a prescription medicine daily to prevent HIV infection. This is called pre-exposure prophylaxis (PrEP). You are  considered at risk if:  You are sexually active and do not regularly use condoms or know the HIV status of your partner(s).  You take drugs by injection.  You are sexually active with a partner who has HIV. Talk with your health care provider about whether you are at high risk of being infected with HIV. If you choose to begin PrEP, you should first be tested for HIV.  You should then be tested every 3 months for as long as you are taking PrEP.  PREGNANCY   If you are premenopausal and you may become pregnant, ask your health care provider about preconception counseling.  If you may become pregnant, take 400 to 800 micrograms (mcg) of folic acid every day.  If you want to prevent pregnancy, talk to your health care provider about birth control (contraception). OSTEOPOROSIS AND MENOPAUSE   Osteoporosis is a disease in which the bones lose minerals and strength with aging. This can result in serious bone fractures. Your risk for osteoporosis can be identified using a bone density scan.  If you are 20 years of age or older, or if you are at risk for osteoporosis and fractures, ask your health care provider if you should be screened.  Ask your health care provider whether you should take a calcium or vitamin D supplement to lower your risk for osteoporosis.  Menopause may have certain physical symptoms and risks.  Hormone replacement therapy may reduce some of these symptoms and risks. Talk to your health care provider about whether hormone replacement therapy is right for you.  HOME CARE INSTRUCTIONS   Schedule regular health, dental, and eye exams.  Stay current with your immunizations.   Do not use any tobacco products including cigarettes, chewing tobacco, or electronic cigarettes.  If you are pregnant, do not drink alcohol.  If you are breastfeeding, limit how much and how often you drink alcohol.  Limit alcohol intake to no more than 1 drink per day for nonpregnant women. One  drink equals 12 ounces of beer, 5 ounces of wine, or 1 ounces of hard liquor.  Do not use street drugs.  Do not share needles.  Ask your health care provider for help if you need support or information about quitting drugs.  Tell your health care provider if you often feel depressed.  Tell your health care provider if you have ever been abused or do not feel safe at home.   This information is not intended to replace advice given to you by your health care provider. Make sure you discuss any questions you have with your health care provider.   Document Released: 11/04/2010 Document Revised: 05/12/2014 Document Reviewed: 03/23/2013 Elsevier Interactive Patient Education [employer].

## 2015-08-13 NOTE — Progress Notes (Signed)
Pre visit review using our clinic review tool, if applicable. No additional management support is needed unless otherwise documented below in the visit note. 

## 2015-08-20 ENCOUNTER — Other Ambulatory Visit: Payer: BLUE CROSS/BLUE SHIELD

## 2015-08-27 ENCOUNTER — Ambulatory Visit
Admission: RE | Admit: 2015-08-27 | Discharge: 2015-08-27 | Disposition: A | Discharge status: Home / Self Care | Payer: BLUE CROSS/BLUE SHIELD | Source: Ambulatory Visit | Attending: Obstetrics & Gynecology | Admitting: Obstetrics & Gynecology

## 2015-08-27 ENCOUNTER — Other Ambulatory Visit: Payer: Self-pay | Admitting: Obstetrics & Gynecology

## 2015-08-27 DIAGNOSIS — R928 Other abnormal and inconclusive findings on diagnostic imaging of breast: Secondary | ICD-10-CM

## 2015-08-27 DIAGNOSIS — C50919 Malignant neoplasm of unspecified site of unspecified female breast: Secondary | ICD-10-CM

## 2015-08-27 HISTORY — PX: BREAST BIOPSY: SHX20

## 2015-08-27 HISTORY — DX: Malignant neoplasm of unspecified site of unspecified female breast: C50.919

## 2015-08-29 ENCOUNTER — Telehealth: Payer: Self-pay | Admitting: *Deleted

## 2015-08-29 ENCOUNTER — Encounter: Payer: Self-pay | Admitting: *Deleted

## 2015-08-29 DIAGNOSIS — C50212 Malignant neoplasm of upper-inner quadrant of left female breast: Secondary | ICD-10-CM

## 2015-08-29 HISTORY — DX: Malignant neoplasm of upper-inner quadrant of left female breast: C50.212

## 2015-08-29 NOTE — Telephone Encounter (Signed)
Confirmed [facility] for 09/05/15 at 1215 .  Instructions and contact information given.

## 2015-09-04 ENCOUNTER — Other Ambulatory Visit: Payer: Self-pay | Admitting: General Surgery

## 2015-09-04 DIAGNOSIS — C50912 Malignant neoplasm of unspecified site of left female breast: Secondary | ICD-10-CM

## 2015-09-05 ENCOUNTER — Encounter: Payer: Self-pay | Admitting: Physical Therapy

## 2015-09-05 ENCOUNTER — Ambulatory Visit (HOSPITAL_BASED_OUTPATIENT_CLINIC_OR_DEPARTMENT_OTHER): Payer: BLUE CROSS/BLUE SHIELD | Admitting: Hematology and Oncology

## 2015-09-05 ENCOUNTER — Other Ambulatory Visit: Payer: Self-pay | Admitting: *Deleted

## 2015-09-05 ENCOUNTER — Ambulatory Visit: Payer: BLUE CROSS/BLUE SHIELD | Attending: General Surgery | Admitting: Physical Therapy

## 2015-09-05 ENCOUNTER — Other Ambulatory Visit (HOSPITAL_BASED_OUTPATIENT_CLINIC_OR_DEPARTMENT_OTHER): Payer: BLUE CROSS/BLUE SHIELD

## 2015-09-05 ENCOUNTER — Encounter: Payer: Self-pay | Admitting: Hematology and Oncology

## 2015-09-05 ENCOUNTER — Ambulatory Visit
Admission: RE | Admit: 2015-09-05 | Discharge: 2015-09-05 | Disposition: A | Discharge status: Home / Self Care | Payer: BLUE CROSS/BLUE SHIELD | Source: Ambulatory Visit | Attending: Radiation Oncology | Admitting: Radiation Oncology

## 2015-09-05 VITALS — BP 133/86 | HR 83 | Temp 98.2°F | Resp 18 | Ht 60.0 in | Wt 261.7 lb

## 2015-09-05 DIAGNOSIS — C50212 Malignant neoplasm of upper-inner quadrant of left female breast: Secondary | ICD-10-CM

## 2015-09-05 DIAGNOSIS — R293 Abnormal posture: Secondary | ICD-10-CM | POA: Diagnosis present

## 2015-09-05 LAB — CBC WITH DIFFERENTIAL/PLATELET
BASO%: 0.5 % (ref 0.0–2.0)
Basophils Absolute: 0 10*3/uL (ref 0.0–0.1)
EOS%: 4.6 % (ref 0.0–7.0)
Eosinophils Absolute: 0.3 10*3/uL (ref 0.0–0.5)
HCT: 41.4 % (ref 34.8–46.6)
HGB: 13.8 g/dL (ref 11.6–15.9)
LYMPH%: 45.2 % (ref 14.0–49.7)
MCH: 29.2 pg (ref 25.1–34.0)
MCHC: 33.3 g/dL (ref 31.5–36.0)
MCV: 87.5 fL (ref 79.5–101.0)
MONO#: 0.6 10*3/uL (ref 0.1–0.9)
MONO%: 10.2 % (ref 0.0–14.0)
NEUT#: 2.2 10*3/uL (ref 1.5–6.5)
NEUT%: 39.5 % (ref 38.4–76.8)
Platelets: 325 10*3/uL (ref 145–400)
RBC: 4.73 10*6/uL (ref 3.70–5.45)
RDW: 13.5 % (ref 11.2–14.5)
WBC: 5.5 10*3/uL (ref 3.9–10.3)
lymph#: 2.5 10*3/uL (ref 0.9–3.3)

## 2015-09-05 LAB — COMPREHENSIVE METABOLIC PANEL
ALT: 13 U/L (ref 0–55)
AST: 18 U/L (ref 5–34)
Albumin: 3.9 g/dL (ref 3.5–5.0)
Alkaline Phosphatase: 58 U/L (ref 40–150)
Anion Gap: 8 mEq/L (ref 3–11)
BUN: 16.6 mg/dL (ref 7.0–26.0)
CO2: 26 mEq/L (ref 22–29)
Calcium: 10.2 mg/dL (ref 8.4–10.4)
Chloride: 105 mEq/L (ref 98–109)
Creatinine: 0.9 mg/dL (ref 0.6–1.1)
EGFR: 88 mL/min/{1.73_m2} — ABNORMAL LOW (ref >90)
Glucose: 75 mg/dl (ref 70–140)
Potassium: 4.2 mEq/L (ref 3.5–5.1)
Sodium: 139 mEq/L (ref 136–145)
Total Bilirubin: 0.51 mg/dL (ref 0.20–1.20)
Total Protein: 7.6 g/dL (ref 6.4–8.3)

## 2015-09-05 NOTE — Progress Notes (Signed)
[facility] CONSULT NOTE  Patient Care Team: Hoyt Koch, MD as PCP - General (Internal Medicine)  CHIEF COMPLAINTS/PURPOSE OF CONSULTATION:  Newly diagnosed breast cancer  HISTORY OF PRESENTING ILLNESS:  Maria Allen 55 y.o. female is here because of recent diagnosis of Left breast cancer. Patient had a routine screening mammogram that revealed left breast abnormality posteriorly at 11:00 position it measured 9 x 8 x 5 mm. Axilla was negative. 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 She was presented at the multidisciplinary tumor board and she is here today to discuss the treatment plan. Patient came in by herself. She lives alone never been married did not have children. She has a Financial planner.  I reviewed her records extensively and collaborated the history with the patient.  SUMMARY OF ONCOLOGIC HISTORY:   Breast cancer of upper-inner quadrant of left female breast ([place])   08/27/2015 Initial Diagnosis Left breast biopsy 11:00: Invasive lobular cancer with LCIS, grade 1,ER 80%, PR 95%, 10%, HER-2 Negativeratio 0.97, T1 BN 0 stage I a clinical stage   08/27/2015 Mammogram Screening detected left breast asymmetry posteriorly at 11:00 position 9 x 8 x 5 mm by ultrasound, axilla negative    In terms of breast cancer risk profile:  She menarched at early age of all 87 and went to menopause at age 62  She had 0 pregnancies She has received birth control pills for approximately 20-25 years.  She was never exposed to fertility medications or hormone replacement therapy.  She has no family history of Breast/GYN/GI cancer  MEDICAL HISTORY:  Past Medical History  Diagnosis Date  . Migraine   . Breast cancer of upper-inner quadrant of left female breast ([place]) 08/29/2015  . Breast cancer ([place])   . Anxiety     SURGICAL HISTORY: Past Surgical History  Procedure  Laterality Date  . No past surgeries    . Colonoscopy with propofol N/A 11/13/2014    Procedure: COLONOSCOPY WITH PROPOFOL;  Surgeon: Jerene Bears, MD;  Location: [facility];  Service: Gastroenterology;  Laterality: N/A;    SOCIAL HISTORY: Social History   Social History  . Marital Status: Single    Spouse Name: N/A  . Number of Children: N/A  . Years of Education: N/A   Occupational History  . Not on file.   Social History Main Topics  . Smoking status: Never Smoker   . Smokeless tobacco: Never Used  . Alcohol Use: No  . Drug Use: No  . Sexual Activity: Not on file   Other Topics Concern  . Not on file   Social History Narrative    FAMILY HISTORY: Family History  Problem Relation Age of Onset  . Diabetes Mother   . Alcohol abuse Father   . Stroke Father   . Mental illness Father   . Colon cancer Neg Hx   . Colon polyps Neg Hx   . Rectal cancer Neg Hx   . Stomach cancer Neg Hx   . Breast cancer Paternal Aunt     ALLERGIES:  has No Known Allergies.  MEDICATIONS:  Current Outpatient Prescriptions  Medication Sig Dispense Refill  . phentermine 37.5 MG capsule Take 37.5 mg by mouth every morning.    Marland Kitchen aspirin 325 MG tablet Take 650 mg by mouth daily as needed for moderate pain or headache. Reported on 09/05/2015     No current facility-administered medications  for this visit.    REVIEW OF SYSTEMS:   Constitutional: Denies fevers, chills or abnormal night sweats Eyes: Denies blurriness of vision, double vision or watery eyes Ears, nose, mouth, throat, and face: Denies mucositis or sore throat Respiratory: Denies cough, dyspnea or wheezes Cardiovascular: Denies palpitation, chest discomfort or lower extremity swelling Gastrointestinal:  Denies nausea, heartburn or change in bowel habits Skin: Denies abnormal skin rashes Lymphatics: Denies new lymphadenopathy or easy bruising Neurological:Denies numbness, tingling or new weaknesses Behavioral/Psych: Mood is  stable, no new changes  Breast:  Denies any palpable lumps or discharge All other systems were reviewed with the patient and are negative.  PHYSICAL EXAMINATION: ECOG PERFORMANCE STATUS: 0 - Asymptomatic  Filed Vitals:   09/05/15 1319  BP: 133/86  Pulse: 83  Temp: 98.2 F (36.8 C)  Resp: 18   Filed Weights   09/05/15 1319  Weight: 261 lb 11.2 oz (118.706 kg)    GENERAL:alert, no distress and comfortable SKIN: skin color, texture, turgor are normal, no rashes or significant lesions EYES: normal, conjunctiva are pink and non-injected, sclera clear OROPHARYNX:no exudate, no erythema and lips, buccal mucosa, and tongue normal  NECK: supple, thyroid normal size, non-tender, without nodularity LYMPH:  no palpable lymphadenopathy in the cervical, axillary or inguinal LUNGS: clear to auscultation and percussion with normal breathing effort HEART: regular rate & rhythm and no murmurs and no lower extremity edema ABDOMEN:abdomen soft, non-tender and normal bowel sounds Musculoskeletal:no cyanosis of digits and no clubbing  PSYCH: alert & oriented x 3 with fluent speech NEURO: no focal motor/sensory deficits BREAST: No palpable nodules in breast. No palpable axillary or supraclavicular lymphadenopathy (exam performed in the presence of a chaperone)   LABORATORY DATA:  I have reviewed the data as listed Lab Results  Component Value Date   WBC 5.5 09/05/2015   HGB 13.8 09/05/2015   HCT 41.4 09/05/2015   MCV 87.5 09/05/2015   PLT 325 09/05/2015   Lab Results  Component Value Date   NA 139 09/05/2015   K 4.2 09/05/2015   CL 106 08/02/2014   CO2 26 09/05/2015    RADIOGRAPHIC STUDIES: I have personally reviewed the radiological reports and agreed with the findings in the report.  ASSESSMENT AND PLAN:  Breast cancer of upper-inner quadrant of left female breast ([place]) Left breast biopsy 11:00: Invasive lobular cancer with LCIS, grade 1,ER 80%, PR 95%, 10%, HER-2 Negativeratio  0.97, T1 BN 0 stage I a clinical stage Screening detected left breast asymmetry posteriorly at 11:00 position 9 x 8 x 5 mm by ultrasound, axilla negative  Pathology and radiology counseling:Discussed with the patient, the details of pathology including the type of breast cancer,the clinical staging, the significance of ER, PR and HER-2/neu receptors and the implications for treatment. After reviewing the pathology in detail, we proceeded to discuss the different treatment options between surgery, radiation, chemotherapy, antiestrogen therapies.  Recommendations:breast MRI for further evaluation being invasive lobular cancer 1. Breast conserving surgery followed by 2. Oncotype DX testing to determine if chemotherapy would be of any benefit followed by 3. Adjuvant radiation therapy followed by 4. Adjuvant antiestrogen therapy  Oncotype counseling: I discussed Oncotype DX test. I explained to the patient that this is a 21 gene panel to evaluate patient tumors DNA to calculate recurrence score. This would help determine whether patient has high risk or intermediate risk or low risk breast cancer. She understands that if her tumor was found to be high risk, she would benefit from systemic chemotherapy.  If low risk, no need of chemotherapy. If she was found to be intermediate risk, we would need to evaluate the score as well as other risk factors and determine if an abbreviated chemotherapy may be of benefit.  Return to clinic after surgery to discuss final pathology report and then determine if Oncotype DX testing will need to be sent.   All questions were answered. The patient knows to call the clinic with any problems, questions or concerns.    Rulon Eisenmenger, MD 09/05/2015

## 2015-09-05 NOTE — Progress Notes (Signed)
Radiation Oncology         [phone]) [phone] ________________________________  Name: Maria Allen MRN: [id number]  Date: 09/05/2015  DOB: [birth year]  SW:FUXNATFTD Loni Muse Sharlet Salina, MD  Stark Klein, MD     REFERRING PHYSICIAN: Stark Klein, MD   DIAGNOSIS: The encounter diagnosis was Breast cancer of upper-inner quadrant of left female breast ([place]).   HISTORY OF PRESENT ILLNESS: Maria Allen is a 55 y.o. female who was found to have asymmetry of the left breast on a mammogram on 08/27/15. She underwent an ultrasound as well that revealed a 41m lesion of the left breast posteriorly. Her axilla was negative for adenopathy. 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 [facility] and to meet with Dr. MLisbeth Renshawregarding the role of radiotherapy.    PREVIOUS RADIATION THERAPY: No   PAST MEDICAL HISTORY:  Past Medical History  Diagnosis Date  . Migraine   . [place] 08/[place]  . [place] ([place]   . Anxiety        PAST SURGICAL HISTORY: Past Surgical History  Procedure Laterality Date  . No past surgeries    . Colonoscopy with propofol N/A 11/13/2014    Procedure: COLONOSCOPY WITH PROPOFOL;  Surgeon: JJerene Bears MD;  Location: [facility];  Service: Gastroenterology;  Laterality: N/A;     FAMILY HISTORY:  Family History  Problem Relation Age of Onset  . Diabetes Mother   . Alcohol abuse Father   . Stroke Father   . Mental illness Father   . Colon cancer Neg Hx   . Colon polyps Neg Hx   . Rectal cancer Neg Hx   . Stomach cancer Neg Hx   . Breast cancer Paternal Aunt      SOCIAL HISTORY:  reports that she has never smoked. She has never used smokeless tobacco. She reports that she does not drink alcohol or use illicit drugs. The patient is single, does not have children, and is unmarried. 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   MEDICATIONS:  Current Outpatient Prescriptions  Medication Sig Dispense Refill  . aspirin 325 MG tablet Take 650 mg by mouth daily as needed for moderate pain or headache. Reported on 09/05/2015    . phentermine 37.5 MG capsule Take 37.5 mg by mouth every morning.     No current facility-administered medications for this encounter.     REVIEW OF SYSTEMS: On review of systems, the patient reports that she is doing well overall. She denies any chest pain, shortness of breath, cough, fevers, chills, night sweats, unintended weight changes. She denies any bowel or bladder disturbances, and denies abdominal pain, nausea or vomiting. She denies any new musculoskeletal or joint aches or pains. A complete review of systems is obtained and is otherwise negative.     PHYSICAL EXAM:   Pain scale 0/10 In general this is a well appearing African American female in no acute distress. She is alert and oriented x4 and appropriate throughout the examination. HEENT reveals that the patient is normocephalic, atraumatic. EOMs are intact. PERRLA. Skin is intact without any evidence of gross lesions. Cardiovascular exam reveals a regular rate and rhythm, no clicks rubs or murmurs are auscultated. Chest is clear to auscultation bilaterally. Lymphatic assessment  is performed and does not reveal any adenopathy in the cervical, supraclavicular, axillary, or inguinal chains. The left breast reveals post biopsy site without ecchymosis. No palpable masses are noted. The right breast is intact without palpable abnormalities. No nipple discharge or bleeding is noted. Abdomen has active bowel sounds in all quadrants and is intact. The abdomen is soft, non tender, non distended. Lower extremities are negative for pretibial pitting edema, deep calf tenderness, cyanosis or clubbing.   ECOG = 0  0 - Asymptomatic  (Fully active, able to carry on all predisease activities without restriction)  1 - Symptomatic but completely ambulatory (Restricted in physically strenuous activity but ambulatory and able to carry out work of a light or sedentary nature. For example, light housework, office work)  2 - Symptomatic, <50% in bed during the day (Ambulatory and capable of all self care but unable to carry out any work activities. Up and about more than 50% of waking hours)  3 - Symptomatic, >50% in bed, but not bedbound (Capable of only limited self-care, confined to bed or chair 50% or more of waking hours)  4 - Bedbound (Completely disabled. Cannot carry on any self-care. Totally confined to bed or chair)  5 - Death   Eustace Pen MM, Creech RH, Tormey DC, et al. [phone]). "Toxicity and response criteria of the [facility]". [place] Oncol. 5 (6): 649-55    LABORATORY DATA:  Lab Results  Component Value Date   WBC 5.5 09/05/2015   HGB 13.8 09/05/2015   HCT 41.4 09/05/2015   MCV 87.5 09/05/2015   PLT 325 09/05/2015   Lab Results  Component Value Date   NA 139 09/05/2015   K 4.2 09/05/2015   CL 106 08/02/2014   CO2 26 09/05/2015   Lab Results  Component Value Date   ALT 13 09/05/2015   AST 18 09/05/2015   ALKPHOS 58 09/05/2015   BILITOT 0.51 09/05/2015      RADIOGRAPHY: Mm Digital Diagnostic Unilat L  08/27/2015  CLINICAL DATA:  Status post ultrasound-guided biopsy of a left breast mass earlier today. EXAM: DIAGNOSTIC LEFT MAMMOGRAM POST ULTRASOUND BIOPSY COMPARISON:  Previous exam(s). FINDINGS: Mammographic images were obtained following ultrasound guided biopsy of the left breast mass at the 11 o'clock axis. At the conclusion of the procedure, a ribbon shaped tissue marker was placed at the biopsy site. Biopsy clip is well positioned relative to the original mammographic finding. IMPRESSION: Postprocedure mammogram for clip placement. Biopsy clip well-positioned at the site  of the original mammographic abnormality/distortion. Final Assessment: Post Procedure Mammograms for Marker Placement Electronically Signed   By: Franki Cabot M.D.   On: 08/27/2015 11:42   US Breast Ltd Uni Left Inc Axilla  08/27/2015  CLINICAL DATA:  55 year old female presenting for screening recall of the left breast asymmetry. EXAM: 2D DIGITAL DIAGNOSTIC UNILATERAL LEFT MAMMOGRAM WITH CAD AND ADJUNCT TOMO LEFT BREAST ULTRASOUND COMPARISON:  Previous exam(s). ACR Breast Density Category c: The breast tissue is heterogeneously dense, which may obscure small masses. FINDINGS: In the medial left breast, posterior depth there is a spiculated mass measuring approximately 8 mm. No other suspicious calcifications, masses or areas of distortion are seen in the left breast. Mammographic images were processed with CAD. Physical exam of the upper inner left breast demonstrates no discrete palpable masses. Ultrasound targeted to the left breast at 11 o'clock, 8 cm from the nipple demonstrates a hypoechoic shadowing spiculated mass measuring 9 x 8 x 5 mm. No abnormal  lymph nodes were identified in the left axilla. IMPRESSION: 1. There is a spiculated mass in the left breast at 11 o'clock which is suspicious for malignancy. 2.  No evidence of left axillary lymphadenopathy. RECOMMENDATION: Ultrasound-guided biopsy is recommended for the spiculated left breast mass, and has been added on to the same day interventional schedule. I have discussed the findings and recommendations with the patient. Results were also provided in writing at the conclusion of the visit. If applicable, a reminder letter will be sent to the patient regarding the next appointment. BI-RADS CATEGORY  4: Suspicious. Electronically Signed   By: Ammie Ferrier M.D.   On: 08/27/2015 10:34   Mm Diag Breast Tomo Uni Left  08/27/2015  CLINICAL DATA:  55 year old female presenting for screening recall of the left breast asymmetry. EXAM: 2D DIGITAL  DIAGNOSTIC UNILATERAL LEFT MAMMOGRAM WITH CAD AND ADJUNCT TOMO LEFT BREAST ULTRASOUND COMPARISON:  Previous exam(s). ACR Breast Density Category c: The breast tissue is heterogeneously dense, which may obscure small masses. FINDINGS: In the medial left breast, posterior depth there is a spiculated mass measuring approximately 8 mm. No other suspicious calcifications, masses or areas of distortion are seen in the left breast. Mammographic images were processed with CAD. Physical exam of the upper inner left breast demonstrates no discrete palpable masses. Ultrasound targeted to the left breast at 11 o'clock, 8 cm from the nipple demonstrates a hypoechoic shadowing spiculated mass measuring 9 x 8 x 5 mm. No abnormal lymph nodes were identified in the left axilla. IMPRESSION: 1. There is a spiculated mass in the left breast at 11 o'clock which is suspicious for malignancy. 2.  No evidence of left axillary lymphadenopathy. RECOMMENDATION: Ultrasound-guided biopsy is recommended for the spiculated left breast mass, and has been added on to the same day interventional schedule. I have discussed the findings and recommendations with the patient. Results were also provided in writing at the conclusion of the visit. If applicable, a reminder letter will be sent to the patient regarding the next appointment. BI-RADS CATEGORY  4: Suspicious. Electronically Signed   By: Ammie Ferrier M.D.   On: 08/27/2015 10:34   [place] Lt Breast Bx W Loc Dev 1st Lesion Img Bx Spec US Guide  08/29/2015  ADDENDUM REPORT: 08/29/2015 08:33 ADDENDUM: Pathology revealed GRADE I-II INVASIVE MAMMARY CARCINOMA, MAMMARY CARCINOMA IN SITU of the Left breast at the 11:00 o'clock location. This was found to be concordant by Dr. Franki Cabot. Pathology results were discussed with the patient by telephone. The patient reported doing well after the biopsy with tenderness at the site. Post biopsy instructions and care were reviewed and questions were  answered. The patient was encouraged to call [facility] for any additional concerns. The patient was referred to [facility] on Sep 05, 2015. Pathology results reported by Terie Purser, RN on 08/29/2015. Electronically Signed   By: Franki Cabot M.D.   On: 08/29/2015 08:33  08/29/2015  CLINICAL DATA:  Patient presents for ultrasound-guided core biopsy of a left breast mass. EXAM: ULTRASOUND GUIDED LEFT BREAST CORE NEEDLE BIOPSY COMPARISON:  Previous exams including a diagnostic mammogram and ultrasound performed earlier same day. PROCEDURE: I met with the patient and we discussed the procedure of ultrasound-guided biopsy, including benefits and alternatives. We discussed the high likelihood of a successful procedure. We discussed the risks of the procedure including infection, bleeding, tissue injury, clip migration, and inadequate sampling. Informed written  consent was given. The usual time-out protocol was performed immediately prior to the procedure. Using sterile technique and 1% Lidocaine as local anesthetic, under direct ultrasound visualization, a 12 gauge vacuum-assisted device was used to perform biopsy of the left breast mass at the 11 o'clock axis, 8 cm from the nipple, at posterior depth,using a lateral approach. At the conclusion of the procedure, a ribbon shaped tissue marker clip was deployed into the biopsy cavity. Follow-up 2-view mammogram was performed and dictated separately. IMPRESSION: Ultrasound-guided biopsy of the left breast mass at the 11 o'clock axis. No apparent complications. Electronically Signed: By: Franki Cabot M.D. On: 08/27/2015 11:41       IMPRESSION: Stage I, cT1NoMx ER/PR positive Her-2 neu negative invasive lobular carcinoma with LCIS   PLAN: Dr. Lisbeth Renshaw discusses the pathology findings and reviews the nature of in situ and invasive lobular disease. The consensus from  the breast conference include MRI of the breast conservation with lumpectomy with sentinel node evaluation, oncotype evaluation,  followed by external radiotherapy to the breast provided that there is no role for radiotherapy. Following this, Dr. Lindi Adie would recommend antiestrogen therapy.  Dr. Lisbeth Renshaw discusses the role of radiotherapy in this setting following surgery and discusses that provided she did not need chemotherapy, we would recommend beginning radiotherapy about 4-6 weeks postop. He discusses that he would offer the patient 66 fractions to the left breast. We will discuss this further and consider breath hold technique to decrease risk of exposure to the heart. She states agreement and understanding of this and is interested in moving forward.  The above documentation reflects my direct findings during this shared patient visit. Please see the separate note by Dr. Lisbeth Renshaw on this date for the remainder of the patient's plan of care.    Carola Rhine, PAC

## 2015-09-05 NOTE — Therapy (Signed)
[place], [name], [address] Phone: [phone]   Fax:  [phone]  Physical Therapy Evaluation  Patient Details  Name: Maria Allen MRN: [id number] Date of Birth: Jun 18, 1960 Referring Provider: Dr. Stark Klein  Encounter Date: 09/05/2015      PT End of Session - 09/05/15 1612    Visit Number 1   Number of Visits 1   PT Start Time 3403   PT Stop Time 1446  Also saw pt from 1545-1600 for a total of 29 minutes   PT Time Calculation (min) 14 min   Activity Tolerance Patient tolerated treatment well   Behavior During Therapy [facility] for tasks assessed/performed      Past Medical History  Diagnosis Date  . Migraine   . Breast cancer of upper-inner quadrant of left female breast ([place]) 08/29/2015  . Breast cancer ([place])   . Anxiety     Past Surgical History  Procedure Laterality Date  . No past surgeries    . Colonoscopy with propofol N/A 11/13/2014    Procedure: COLONOSCOPY WITH PROPOFOL;  Surgeon: Jerene Bears, MD;  Location: [facility];  Service: Gastroenterology;  Laterality: N/A;    There were no vitals filed for this visit.       Subjective Assessment - 09/05/15 1602    Subjective Patient reports she is here today to be seen by her multi-disciplinary team for an assessment of her newly diagnosed left breast cancer.   Pertinent History Patient was diagnosed on 08/06/15 with left invasive lobular carcinoma and lobular carcinoma in situ breast cancer in the upper inner quadrant.  It measures 9 mm, is Grade 1, is ER/PR positive, HER2 negative and has a Ki67 of 10%.     Patient Stated Goals Reduce lymphedema risk and learn post op shoulder ROM HEP   Currently in Pain? No/denies            [facility] PT Assessment - 09/05/15 0001    Assessment   Medical Diagnosis Left breast cancer   Referring Provider Dr. Stark Klein   Onset Date/Surgical Date 08/06/15   Hand Dominance Right   Prior Therapy  none   Precautions   Precautions Other (comment)   Precaution Comments Active breast cancer   Restrictions   Weight Bearing Restrictions No   Balance Screen   Has the patient fallen in the past 6 months No   Has the patient had a decrease in activity level because of a fear of falling?  No   Is the patient reluctant to leave their home because of a fear of falling?  No   Home Environment   Living Environment Private residence   Living Arrangements Alone   Available Help at Discharge Friend(s)   Prior Function   Level of Independence Independent   Vocation Full time employment   Vocation Requirements hairdresser   Leisure She gardens frequently at her house   Cognition   Overall Cognitive Status Within [place] for tasks assessed   Posture/Postural Control   Posture/Postural Control Postural limitations   Postural Limitations Rounded Shoulders;Forward head   ROM / Strength   AROM / PROM / Strength AROM;Strength   AROM   AROM Assessment Site Shoulder   Right/Left Shoulder Right;Left   Right Shoulder Extension 44 Degrees   Right Shoulder Flexion 150 Degrees   Right Shoulder ABduction 164 Degrees   Right Shoulder Internal Rotation 59 Degrees   Right Shoulder External Rotation 68 Degrees  Left Shoulder Extension 40 Degrees   Left Shoulder Flexion 145 Degrees   Left Shoulder ABduction 157 Degrees   Left Shoulder Internal Rotation 69 Degrees   Left Shoulder External Rotation 75 Degrees   Strength   Overall Strength Within functional limits for tasks performed           LYMPHEDEMA/ONCOLOGY QUESTIONNAIRE - 09/05/15 1609    Type   Cancer Type Left breast cancer   Lymphedema Assessments   Lymphedema Assessments Upper extremities   Right Upper Extremity Lymphedema   10 cm Proximal to Olecranon Process 38.2 cm   Olecranon Process 27 cm   10 cm Proximal to Ulnar Styloid Process 25.3 cm   Just Proximal to Ulnar Styloid Process 16.2 cm   Across Hand at [employer] 19.8 cm   At [place] of 2nd Digit 6.7 cm   Left Upper Extremity Lymphedema   10 cm Proximal to Olecranon Process 37.8 cm   Olecranon Process 26.7 cm   10 cm Proximal to Ulnar Styloid Process 24.3 cm   Just Proximal to Ulnar Styloid Process 16.5 cm   Across Hand at [employer] 19.3 cm   At [place] of 2nd Digit 6.2 cm      Patient was instructed today in a home exercise program today for post op shoulder range of motion. These included active assist shoulder flexion in sitting, scapular retraction, wall walking with shoulder abduction, and hands behind head external rotation.  She was encouraged to do these twice a day, holding 3 seconds and repeating 5 times when permitted by her physician.         PT Education - 09/05/15 1611    Education provided Yes   Education Details Lymphedema risk reduction and post op shoulder ROM HEP   Person(s) Educated Patient   Methods Explanation;Demonstration;Handout   Comprehension Returned demonstration;Verbalized understanding              Breast Clinic Goals - 09/05/15 1616    Patient will be able to verbalize understanding of pertinent lymphedema risk reduction practices relevant to her diagnosis specifically related to skin care.   Time 1   Period Days   Status Achieved   Patient will be able to return demonstrate and/or verbalize understanding of the post-op home exercise program related to regaining shoulder range of motion.   Time 1   Period Days   Status Achieved   Patient will be able to verbalize understanding of the importance of attending the postoperative After Breast Cancer Class for further lymphedema risk reduction education and therapeutic exercise.   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  It measures 9  mm, is Grade 1, is ER/PR positive, HER2 negative and has a Ki67 of 10%.  She is planning to have a left lumpectomy and sentinel node biopsy followed by Oncotype testing, radiation, and anti-estrogen therapy.  She may benefit from post op PT to regain shoulder ROM and reduce lympehdema risk.   Rehab Potential Excellent   Clinical Impairments Affecting Rehab Potential none   PT Frequency One time visit   PT Treatment/Interventions Therapeutic exercise;Patient/family education   PT Next Visit Plan Will f/u after surgery   PT Home Exercise Plan  Post op shoulder ROM HEP   Consulted and Agree with Plan of Care Patient      Patient will benefit from skilled therapeutic intervention in order to improve the following deficits and impairments:  Decreased strength, Decreased knowledge of precautions, Pain, Impaired UE functional use, Decreased range of motion  Visit Diagnosis: Carcinoma of left breast upper inner quadrant (HCC) - Plan: PT plan of care cert/re-cert  Abnormal posture - Plan: PT plan of care cert/re-cert   Patient will follow up at outpatient cancer rehab if needed following surgery.  If the patient requires physical therapy at that time, a specific plan will be dictated and sent to the referring physician for approval. The patient was educated today on appropriate basic range of motion exercises to begin post operatively and the importance of attending the After Breast Cancer class following surgery.  Patient was educated today on lymphedema risk reduction practices as it pertains to recommendations that will benefit the patient immediately following surgery.  She verbalized good understanding.  No additional physical therapy is indicated at this time.      Problem List Patient Active Problem List   Diagnosis Date Noted  . Breast cancer of upper-inner quadrant of left female breast ([place]) 08/29/2015  . Routine general medical examination at a health care facility 08/13/2015  . Special  screening for malignant neoplasms, colon   . Morbid obesity ([place]) 08/03/2014    Annia Friendly, PT 09/05/2015 4:17 PM  [place], [name], [address] Phone: [phone]   Fax:  [phone]  Name: Maria Allen MRN: [id number] Date of Birth: November 25, 1960

## 2015-09-05 NOTE — Patient Instructions (Signed)
[facility] CLASS After Breast Cancer Class  After Breast Cancer Class is a specially designed exercise class to assist you in a safe recover after having breast cancer surgery.  In this class you will learn how to get back to full function whether your drains were just removed or if you had surgery a month ago.  This one-time class is held the 1st and 3rd Monday of every month from 11:00 a.m. until 12:00 noon at the [facility] located at [place], [place] [address]  This class is FREE and space is limited. For more information or to register for the next available class, call [phone].  Class Goals   Understand specific stretches to improve the flexibility of you chest and shoulder.  Learn ways to safely strengthen your upper body and improve your posture.  Understand the warning signs of infection and why you may be at risk for an arm infection.  Learn about Lymphedema and prevention.  ** You do not attend this class until after surgery.  Drains must be removed to participate  Patient was instructed today in a home exercise program today for post op shoulder range of motion. These included active assist shoulder flexion in sitting, scapular retraction, wall walking with shoulder abduction, and hands behind head external rotation.  She was encouraged to do these twice a day, holding 3 seconds and repeating 5 times when permitted by her physician.

## 2015-09-05 NOTE — Assessment & Plan Note (Signed)
Left breast biopsy 11:00: Invasive lobular cancer with LCIS, grade 1,ER 80%, PR 95%, 10%, HER-2 Negativeratio 0.97, T1 BN 0 stage I a clinical stage Screening detected left breast asymmetry posteriorly at 11:00 position 9 x 8 x 5 mm by ultrasound, axilla negative  Pathology and radiology counseling:Discussed with the patient, the details of pathology including the type of breast cancer,the clinical staging, the significance of ER, PR and HER-2/neu receptors and the implications for treatment. After reviewing the pathology in detail, we proceeded to discuss the different treatment options between surgery, radiation, chemotherapy, antiestrogen therapies.  Recommendations:breast MRI for further evaluation being invasive lobular cancer 1. Breast conserving surgery followed by 2. Oncotype DX testing to determine if chemotherapy would be of any benefit followed by 3. Adjuvant radiation therapy followed by 4. Adjuvant antiestrogen therapy  Oncotype counseling: I discussed Oncotype DX test. I explained to the patient that this is a 21 gene panel to evaluate patient tumors DNA to calculate recurrence score. This would help determine whether patient has high risk or intermediate risk or low risk breast cancer. She understands that if her tumor was found to be high risk, she would benefit from systemic chemotherapy. If low risk, no need of chemotherapy. If she was found to be intermediate risk, we would need to evaluate the score as well as other risk factors and determine if an abbreviated chemotherapy may be of benefit.  Return to clinic after surgery to discuss final pathology report and then determine if Oncotype DX testing will need to be sent.

## 2015-09-05 NOTE — Progress Notes (Signed)
Note created by Dt. Gudena during office visit, copy to patient, original to scan.

## 2015-09-06 ENCOUNTER — Encounter: Payer: Self-pay | Admitting: General Practice

## 2015-09-06 NOTE — Progress Notes (Signed)
[place] Psychosocial Distress Screening Spiritual Care  Maria Allen was seen by counseling intern Vaughan Sine at [facility] to introduce [place] team/resources, reviewing distress screen per protocol.  The patient scored a 7 on the Psychosocial Distress Thermometer which indicates severe distress. Counseling intern also assessed for distress and other psychosocial needs.   ONCBCN DISTRESS SCREENING 09/06/2015  Screening Type Initial Screening  Distress experienced in past week (1-10) 7  Practical problem type Housing;Insurance;Work/school  Emotional problem type Nervousness/Anxiety  Spiritual/Religous concerns type Relating to God  Information Concerns Type Lack of info about diagnosis;Lack of info about treatment;Lack of info about complementary therapy choices;Lack of info about maintaining fitness  Physical Problem type Constipation/diarrhea;Skin dry/itchy  Referral to support programs Yes   Pt states that she normally feels very positive and has a low-stress life, so distress indicated on screen is due to coping with dx; she also reports that receiving further medical info and support from clinic team has reduced her distress to a 4.  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  Two sisters are sources of support.  Gardening is a source of meaning and enjoyment.    Follow up needed: Yes.  Plan to f/u by call or note to offer further support.  Please also page as needs arise.  Thank you.  [place], [place], [facility] Counseling Intern Supervisor Pager [phone] Voicemail [phone]

## 2015-09-10 ENCOUNTER — Telehealth: Payer: Self-pay | Admitting: *Deleted

## 2015-09-10 ENCOUNTER — Inpatient Hospital Stay: Admission: RE | Admit: 2015-09-10 | Payer: BLUE CROSS/BLUE SHIELD | Source: Ambulatory Visit

## 2015-09-10 ENCOUNTER — Ambulatory Visit
Admission: RE | Admit: 2015-09-10 | Discharge: 2015-09-10 | Disposition: A | Discharge status: Home / Self Care | Payer: BLUE CROSS/BLUE SHIELD | Source: Ambulatory Visit | Attending: General Surgery | Admitting: General Surgery

## 2015-09-10 DIAGNOSIS — C50212 Malignant neoplasm of upper-inner quadrant of left female breast: Secondary | ICD-10-CM

## 2015-09-10 MED ORDER — GADOBENATE DIMEGLUMINE 529 MG/ML IV SOLN
20.0000 mL | Freq: Once | INTRAVENOUS | Status: AC | PRN
  Administered 2015-09-10: 20 mL via INTRAVENOUS

## 2015-09-10 NOTE — Telephone Encounter (Signed)
Spoke to pt concerning [place] from 09/05/15. Denies questions or concerns regarding dx or treatment care plan. Encourage pt to call with needs. Received verbal understanding. Contact information provided.

## 2015-09-13 ENCOUNTER — Encounter: Payer: Self-pay | Admitting: *Deleted

## 2015-09-13 ENCOUNTER — Telehealth: Payer: Self-pay | Admitting: Hematology and Oncology

## 2015-09-13 ENCOUNTER — Other Ambulatory Visit: Payer: Self-pay | Admitting: General Surgery

## 2015-09-13 DIAGNOSIS — C50912 Malignant neoplasm of unspecified site of left female breast: Secondary | ICD-10-CM

## 2015-09-13 NOTE — Telephone Encounter (Signed)
s.w. pt and gv appt....pt ok and aware

## 2015-09-26 ENCOUNTER — Encounter: Payer: Self-pay | Admitting: *Deleted

## 2015-09-26 NOTE — Progress Notes (Signed)
[facility] Clinical Social Work  Clinical Social Work met with pt in CSW office to offer support and assess for needs.  CSW reviewed ADRs packet with pt and assistance options. Pt provided pt with assistance applications and pt will review and return next week. Pt aware to reach out as needed.    Clinical Social Work interventions: Resource education Grier Hock, LCSW Clinical Social Worker [facility]  [facility] Phone: [phone] Fax: [phone]   

## 2015-09-27 ENCOUNTER — Encounter: Payer: Self-pay | Admitting: Hematology and Oncology

## 2015-09-27 NOTE — Progress Notes (Signed)
Patient called and states she was referred by SW inquiring about the [employer]. Patient currently not in active treatment but will start with Radiation after her lumpectomy. Advised patient Maria Allen and Maria Allen are the financial advocates for Radiation and she may meet with them to discuss once she starts treatment.

## 2015-10-03 ENCOUNTER — Ambulatory Visit (HOSPITAL_COMMUNITY): Payer: BLUE CROSS/BLUE SHIELD

## 2015-10-03 ENCOUNTER — Other Ambulatory Visit (HOSPITAL_COMMUNITY): Payer: Self-pay | Admitting: *Deleted

## 2015-10-03 NOTE — Pre-Procedure Instructions (Signed)
Maria Allen  10/03/2015      [facility] - [place], [place] - [address] [facility] [place] [address] Phone: [phone] Fax: [phone]    Your procedure is scheduled on Tuesday, October 09, 2015 at 10:50 AM.  Report to [facility] Entrance "A" Admitting Office at 8:45 AM.  Call this number if you have problems the morning of surgery: [phone]  Any questions prior to day of surgery, please call [phone] between 8 & 4 PM.   Remember:  Do not eat food or drink liquids after midnight Monday, 10/08/15.   Stop Aspirin and Phentermine as of today.   Do not wear jewelry, make-up or nail polish.  Do not wear lotions, powders, or perfumes.  You may NOT wear deodorant.  Do not shave 48 hours prior to surgery.   Do not bring valuables to the hospital.  [facility] is not responsible for any belongings or valuables.  Contacts, dentures or bridgework may not be worn into surgery.  Leave your suitcase in the car.  After surgery it may be brought to your room.  For patients admitted to the hospital, discharge time will be determined by your treatment team.  Patients discharged the day of surgery will not be allowed to drive home.   Special instructions:   - Preparing for Surgery  Before surgery, you can play an important role.  Because skin is not sterile, your skin needs to be as free of germs as possible.  You can reduce the number of germs on you skin by washing with CHG (chlorahexidine gluconate) soap before surgery.  CHG is an antiseptic cleaner which kills germs and bonds with the skin to continue killing germs even after washing.  Please DO NOT use if you have an allergy to CHG or antibacterial soaps.  If your skin becomes reddened/irritated stop using the CHG and inform your nurse when you arrive at Short Stay.  Do not shave (including legs and underarms) for at least 48 hours prior to the first CHG shower.  You  may shave your face.  Please follow these instructions carefully:   1.  Shower with CHG Soap the night before surgery and the                                morning of Surgery.  2.  If you choose to wash your hair, wash your hair first as usual with your       normal shampoo.  3.  After you shampoo, rinse your hair and body thoroughly to remove the                      Shampoo.  4.  Use CHG as you would any other liquid soap.  You can apply chg directly       to the skin and wash gently with scrungie or a clean washcloth.  5.  Apply the CHG Soap to your body ONLY FROM THE NECK DOWN.        Do not use on open wounds or open sores.  Avoid contact with your eyes,       ears, mouth and genitals (private parts).  Wash genitals (private parts) with your normal soap.  6.  Wash thoroughly, paying special attention to the area where your surgery        will be performed.  7.  Thoroughly rinse your body with warm water from the neck down.  8.  DO NOT shower/wash with your normal soap after using and rinsing off       the CHG Soap.  9.  Pat yourself dry with a clean towel.            10.  Wear clean pajamas.            11.  Place clean sheets on your bed the night of your first shower and do not        sleep with pets.  Day of Surgery  Do not apply any lotions/deodorants the morning of surgery.  Please wear clean clothes to the hospital.   Please read over the following fact sheets that you were given. Pain Booklet, Coughing and Deep Breathing and Surgical Site Infection Prevention

## 2015-10-04 ENCOUNTER — Encounter: Payer: Self-pay | Admitting: General Practice

## 2015-10-04 ENCOUNTER — Encounter (HOSPITAL_COMMUNITY)
Admission: RE | Admit: 2015-10-04 | Discharge: 2015-10-04 | Disposition: A | Discharge status: Home / Self Care | Payer: BLUE CROSS/BLUE SHIELD | Source: Ambulatory Visit | Attending: General Surgery | Admitting: General Surgery

## 2015-10-04 DIAGNOSIS — Z01812 Encounter for preprocedural laboratory examination: Secondary | ICD-10-CM | POA: Diagnosis present

## 2015-10-04 DIAGNOSIS — C50912 Malignant neoplasm of unspecified site of left female breast: Secondary | ICD-10-CM | POA: Insufficient documentation

## 2015-10-04 LAB — CBC
HCT: 42.6 % (ref 36.0–46.0)
Hemoglobin: 13.4 g/dL (ref 12.0–15.0)
MCH: 28.1 pg (ref 26.0–34.0)
MCHC: 31.5 g/dL (ref 30.0–36.0)
MCV: 89.3 fL (ref 78.0–100.0)
Platelets: 347 10*3/uL (ref 150–400)
RBC: 4.77 MIL/uL (ref 3.87–5.11)
RDW: 13.9 % (ref 11.5–15.5)
WBC: 5.8 10*3/uL (ref 4.0–10.5)

## 2015-10-04 NOTE — Pre-Procedure Instructions (Signed)
Maria Allen  10/04/2015      [facility] [place], [place] - [address] [place] Allen [address] Phone: [phone] Fax: [phone]    Your procedure is scheduled on Tuesday, October 09, 2015 at 10:50 AM.  Report to [facility] Entrance "A" Admitting Office at 8:45 AM.  Call this number if you have problems the morning of surgery: [phone]  Any questions prior to day of surgery, please call [phone] between 8 & 4 PM.   Remember:  Do not eat food or drink liquids after midnight Monday, 10/08/15.   Stop Aspirin and Phentermine as of today.   Do not wear jewelry, make-up or nail polish.  Do not wear lotions, powders, or perfumes.  You may NOT wear deodorant.  Do not shave 48 hours prior to surgery.   Do not bring valuables to the hospital.  [facility] is not responsible for any belongings or valuables.  Contacts, dentures or bridgework may not be worn into surgery.  Leave your suitcase in the car.  After surgery it may be brought to your room.  For patients admitted to the hospital, discharge time will be determined by your treatment team.  Patients discharged the day of surgery will not be allowed to drive home.   Special instructions:  [facility] - Preparing for Surgery  Before surgery, you can play an important role.  Because skin is not sterile, your skin needs to be as free of germs as possible.  You can reduce the number of germs on you skin by washing with CHG (chlorahexidine gluconate) soap before surgery.  CHG is an antiseptic cleaner which kills germs and bonds with the skin to continue killing germs even after washing.  Please DO NOT use if you have an allergy to CHG or antibacterial soaps.  If your skin becomes reddened/irritated stop using the CHG and inform your nurse when you arrive at Short Stay.  Do not shave (including legs and underarms) for at least 48 hours prior to the first CHG shower.  You may  shave your face.  Please follow these instructions carefully:   1.  Shower with CHG Soap the night before surgery and the                                morning of Surgery.  2.  If you choose to wash your hair, wash your hair first as usual with your       normal shampoo.  3.  After you shampoo, rinse your hair and body thoroughly to remove the                      Shampoo.  4.  Use CHG as you would any other liquid soap.  You can apply chg directly       to the skin and wash gently with scrungie or a clean washcloth.  5.  Apply the CHG Soap to your body ONLY FROM THE NECK DOWN.        Do not use on open wounds or open sores.  Avoid contact with your eyes,       ears, mouth and genitals (private parts).  Wash genitals (private parts) with your normal soap.  6.  Wash thoroughly, paying special attention to the area where your surgery        will be performed.  7.  Thoroughly rinse your body with warm water from the neck down.  8.  DO NOT shower/wash with your normal soap after using and rinsing off       the CHG Soap.  9.  Pat yourself dry with a clean towel.            10.  Wear clean pajamas.            11.  Place clean sheets on your bed the night of your first shower and do not        sleep with pets.  Day of Surgery  Do not apply any lotions/deodorants the morning of surgery.  Please wear clean clothes to the hospital.   Please read over the following fact sheets that you were given. Pain Booklet, Coughing and Deep Breathing and Surgical Site Infection Prevention

## 2015-10-04 NOTE — Progress Notes (Signed)
Spiritual Care Note  Followed up with Maria Allen by phone.  She was in good spirits, sharing about meaningful Memorial Day weekend trip to visit family in [place].  She is engaging in life and using gratitude to cope.  She reports good support from [employer] and plans to reach out to [place] as desired.  Please also page if needs arise/circumstances change.  Thank you.  [place], [place], [facility] Pager [phone] Voicemail [phone]

## 2015-10-08 ENCOUNTER — Ambulatory Visit
Admission: RE | Admit: 2015-10-08 | Discharge: 2015-10-08 | Disposition: A | Discharge status: Home / Self Care | Payer: BLUE CROSS/BLUE SHIELD | Source: Ambulatory Visit | Attending: General Surgery | Admitting: General Surgery

## 2015-10-08 DIAGNOSIS — C50912 Malignant neoplasm of unspecified site of left female breast: Secondary | ICD-10-CM

## 2015-10-08 MED ORDER — DEXTROSE 5 % IV SOLN
3.0000 g | INTRAVENOUS | Status: AC
  Administered 2015-10-09: 3 g via INTRAVENOUS
  Filled 2015-10-08: qty 3000

## 2015-10-09 ENCOUNTER — Ambulatory Visit (HOSPITAL_COMMUNITY)
Admission: RE | Admit: 2015-10-09 | Discharge: 2015-10-09 | Disposition: A | Discharge status: Home / Self Care | Payer: BLUE CROSS/BLUE SHIELD | Source: Ambulatory Visit | Attending: General Surgery | Admitting: General Surgery

## 2015-10-09 ENCOUNTER — Encounter (HOSPITAL_COMMUNITY)
Admission: RE | Disposition: A | Discharge status: Home / Self Care | Payer: Self-pay | Source: Ambulatory Visit | Attending: General Surgery

## 2015-10-09 ENCOUNTER — Ambulatory Visit (HOSPITAL_COMMUNITY): Payer: BLUE CROSS/BLUE SHIELD | Admitting: Certified Registered Nurse Anesthetist

## 2015-10-09 ENCOUNTER — Ambulatory Visit
Admission: RE | Admit: 2015-10-09 | Discharge: 2015-10-09 | Disposition: A | Discharge status: Home / Self Care | Payer: BLUE CROSS/BLUE SHIELD | Source: Ambulatory Visit | Attending: General Surgery | Admitting: General Surgery

## 2015-10-09 DIAGNOSIS — C50912 Malignant neoplasm of unspecified site of left female breast: Secondary | ICD-10-CM

## 2015-10-09 DIAGNOSIS — Z6841 Body Mass Index (BMI) 40.0 and over, adult: Secondary | ICD-10-CM | POA: Diagnosis not present

## 2015-10-09 DIAGNOSIS — Z803 Family history of malignant neoplasm of breast: Secondary | ICD-10-CM | POA: Insufficient documentation

## 2015-10-09 DIAGNOSIS — C50212 Malignant neoplasm of upper-inner quadrant of left female breast: Secondary | ICD-10-CM | POA: Diagnosis present

## 2015-10-09 HISTORY — PX: BREAST LUMPECTOMY: SHX2

## 2015-10-09 HISTORY — PX: BREAST LUMPECTOMY WITH RADIOACTIVE SEED AND SENTINEL LYMPH NODE BIOPSY: SHX6550

## 2015-10-09 SURGERY — BREAST LUMPECTOMY WITH RADIOACTIVE SEED AND SENTINEL LYMPH NODE BIOPSY
Anesthesia: General | Site: Breast | Laterality: Left

## 2015-10-09 MED ORDER — FENTANYL CITRATE (PF) 100 MCG/2ML IJ SOLN
INTRAMUSCULAR | Status: AC
  Filled 2015-10-09: qty 2

## 2015-10-09 MED ORDER — LACTATED RINGERS IV SOLN: INTRAVENOUS | Status: DC

## 2015-10-09 MED ORDER — LACTATED RINGERS IV SOLN
INTRAVENOUS | Status: DC
Start: 2015-10-09 — End: 2015-10-09
  Administered 2015-10-09 (×2): via INTRAVENOUS

## 2015-10-09 MED ORDER — ONDANSETRON HCL 4 MG/2ML IJ SOLN
INTRAMUSCULAR | Status: DC | PRN
  Administered 2015-10-09: 4 mg via INTRAVENOUS

## 2015-10-09 MED ORDER — FENTANYL CITRATE (PF) 250 MCG/5ML IJ SOLN
INTRAMUSCULAR | Status: DC | PRN
  Administered 2015-10-09: 50 ug via INTRAVENOUS
  Administered 2015-10-09 (×2): 25 ug via INTRAVENOUS

## 2015-10-09 MED ORDER — DEXAMETHASONE SODIUM PHOSPHATE 10 MG/ML IJ SOLN
INTRAMUSCULAR | Status: AC
  Filled 2015-10-09: qty 1

## 2015-10-09 MED ORDER — METHYLENE BLUE 0.5 % INJ SOLN
INTRAVENOUS | Status: AC
  Filled 2015-10-09: qty 10

## 2015-10-09 MED ORDER — ACETAMINOPHEN 325 MG PO TABS
650.0000 mg | ORAL_TABLET | ORAL | Status: DC | PRN
  Filled 2015-10-09: qty 2

## 2015-10-09 MED ORDER — SODIUM CHLORIDE 0.9% FLUSH: 3.0000 mL | Freq: Two times a day (BID) | INTRAVENOUS | Status: DC

## 2015-10-09 MED ORDER — 0.9 % SODIUM CHLORIDE (POUR BTL) OPTIME
TOPICAL | Status: DC | PRN
  Administered 2015-10-09: 1000 mL

## 2015-10-09 MED ORDER — MIDAZOLAM HCL 2 MG/2ML IJ SOLN
INTRAMUSCULAR | Status: AC
  Filled 2015-10-09: qty 2

## 2015-10-09 MED ORDER — OXYCODONE HCL 5 MG PO TABS
5.0000 mg | ORAL_TABLET | ORAL | Status: DC | PRN
  Administered 2015-10-09: 10 mg via ORAL
  Filled 2015-10-09 (×2): qty 2

## 2015-10-09 MED ORDER — FENTANYL CITRATE (PF) 250 MCG/5ML IJ SOLN
INTRAMUSCULAR | Status: AC
  Filled 2015-10-09: qty 5

## 2015-10-09 MED ORDER — DEXAMETHASONE SODIUM PHOSPHATE 10 MG/ML IJ SOLN
INTRAMUSCULAR | Status: DC | PRN
  Administered 2015-10-09: 5 mg via INTRAVENOUS

## 2015-10-09 MED ORDER — LIDOCAINE HCL (PF) 1 % IJ SOLN
INTRAMUSCULAR | Status: AC
  Filled 2015-10-09: qty 30

## 2015-10-09 MED ORDER — MIDAZOLAM HCL 2 MG/2ML IJ SOLN
2.0000 mg | Freq: Once | INTRAMUSCULAR | Status: AC
  Administered 2015-10-09: 2 mg via INTRAVENOUS

## 2015-10-09 MED ORDER — FENTANYL CITRATE (PF) 100 MCG/2ML IJ SOLN
50.0000 ug | Freq: Once | INTRAMUSCULAR | Status: AC
  Administered 2015-10-09: 50 ug via INTRAVENOUS

## 2015-10-09 MED ORDER — OXYCODONE-ACETAMINOPHEN 5-325 MG PO TABS: 1.0000 | ORAL_TABLET | ORAL | Status: DC | PRN

## 2015-10-09 MED ORDER — FENTANYL CITRATE (PF) 100 MCG/2ML IJ SOLN
25.0000 ug | INTRAMUSCULAR | Status: DC | PRN
  Administered 2015-10-09 (×2): 50 ug via INTRAVENOUS
  Administered 2015-10-09: 25 ug via INTRAVENOUS

## 2015-10-09 MED ORDER — BUPIVACAINE-EPINEPHRINE (PF) 0.25% -1:200000 IJ SOLN
INTRAMUSCULAR | Status: AC
  Filled 2015-10-09: qty 30

## 2015-10-09 MED ORDER — TECHNETIUM TC 99M SULFUR COLLOID FILTERED
1.0000 | Freq: Once | INTRAVENOUS | Status: AC | PRN
  Administered 2015-10-09: 1 via INTRADERMAL

## 2015-10-09 MED ORDER — LIDOCAINE HCL (CARDIAC) 20 MG/ML IV SOLN
INTRAVENOUS | Status: DC | PRN
  Administered 2015-10-09: 50 mg via INTRAVENOUS

## 2015-10-09 MED ORDER — SODIUM CHLORIDE 0.9 % IV SOLN: 250.0000 mL | INTRAVENOUS | Status: DC | PRN

## 2015-10-09 MED ORDER — SODIUM CHLORIDE 0.9% FLUSH: 3.0000 mL | INTRAVENOUS | Status: DC | PRN

## 2015-10-09 MED ORDER — PHENYLEPHRINE HCL 10 MG/ML IJ SOLN
INTRAMUSCULAR | Status: DC | PRN
  Administered 2015-10-09: 40 ug via INTRAVENOUS
  Administered 2015-10-09: 80 ug via INTRAVENOUS
  Administered 2015-10-09: 40 ug via INTRAVENOUS

## 2015-10-09 MED ORDER — PROPOFOL 10 MG/ML IV BOLUS
INTRAVENOUS | Status: DC | PRN
  Administered 2015-10-09: 10 mg via INTRAVENOUS
  Administered 2015-10-09: 50 mg via INTRAVENOUS
  Administered 2015-10-09 (×2): 10 mg via INTRAVENOUS
  Administered 2015-10-09: 150 mg via INTRAVENOUS

## 2015-10-09 MED ORDER — LIDOCAINE HCL 1 % IJ SOLN
INTRAMUSCULAR | Status: DC | PRN
  Administered 2015-10-09: 3 mL via INTRAMUSCULAR

## 2015-10-09 MED ORDER — OXYCODONE HCL 5 MG PO TABS
ORAL_TABLET | ORAL | Status: AC
  Filled 2015-10-09: qty 2

## 2015-10-09 MED ORDER — ACETAMINOPHEN 650 MG RE SUPP
650.0000 mg | RECTAL | Status: DC | PRN
  Filled 2015-10-09: qty 1

## 2015-10-09 SURGICAL SUPPLY — 65 items
APPLIER CLIP 9.375 MED OPEN (MISCELLANEOUS) ×3
APR CLP MED 9.3 20 MLT OPN (MISCELLANEOUS) ×1
BINDER BREAST LRG (GAUZE/BANDAGES/DRESSINGS) IMPLANT
BINDER BREAST XLRG (GAUZE/BANDAGES/DRESSINGS) ×2 IMPLANT
BLADE SURG 15 STRL LF DISP TIS (BLADE) ×1 IMPLANT
BLADE SURG 15 STRL SS (BLADE) ×3
BNDG COHESIVE 4X5 TAN STRL (GAUZE/BANDAGES/DRESSINGS) ×3 IMPLANT
CANISTER SUCTION 2500CC (MISCELLANEOUS) ×3 IMPLANT
CHLORAPREP W/TINT 26ML (MISCELLANEOUS) ×3 IMPLANT
CLIP APPLIE 9.375 MED OPEN (MISCELLANEOUS) ×1 IMPLANT
CLIP TI LARGE 6 (CLIP) ×3 IMPLANT
CLIP TI MEDIUM 6 (CLIP) ×3 IMPLANT
CLIP TI WIDE RED SMALL 6 (CLIP) ×3 IMPLANT
CONT SPEC 4OZ CLIKSEAL STRL BL (MISCELLANEOUS) ×4 IMPLANT
COVER PROBE W GEL 5X96 (DRAPES) ×3 IMPLANT
COVER SURGICAL LIGHT HANDLE (MISCELLANEOUS) ×3 IMPLANT
DRAPE CHEST BREAST 15X10 FENES (DRAPES) ×3 IMPLANT
DRAPE UNIVERSAL PACK (DRAPES) ×3 IMPLANT
DRAPE UTILITY XL STRL (DRAPES) ×6 IMPLANT
ELECT BLADE 4.0 EZ CLEAN MEGAD (MISCELLANEOUS) ×3
ELECT CAUTERY BLADE 6.4 (BLADE) ×3 IMPLANT
ELECT REM PT RETURN 9FT ADLT (ELECTROSURGICAL) ×3
ELECTRODE BLDE 4.0 EZ CLN MEGD (MISCELLANEOUS) IMPLANT
ELECTRODE REM PT RTRN 9FT ADLT (ELECTROSURGICAL) ×1 IMPLANT
GLOVE BIO SURGEON STRL SZ 6 (GLOVE) ×3 IMPLANT
GLOVE BIO SURGEON STRL SZ 6.5 (GLOVE) ×1 IMPLANT
GLOVE BIO SURGEON STRL SZ8 (GLOVE) ×2 IMPLANT
GLOVE BIO SURGEONS STRL SZ 6.5 (GLOVE) ×1
GLOVE BIOGEL PI IND STRL 6.5 (GLOVE) ×1 IMPLANT
GLOVE BIOGEL PI IND STRL 8.5 (GLOVE) IMPLANT
GLOVE BIOGEL PI INDICATOR 6.5 (GLOVE) ×4
GLOVE BIOGEL PI INDICATOR 8.5 (GLOVE) ×2
GOWN STRL REUS W/ TWL LRG LVL3 (GOWN DISPOSABLE) ×1 IMPLANT
GOWN STRL REUS W/TWL 2XL LVL3 (GOWN DISPOSABLE) ×3 IMPLANT
GOWN STRL REUS W/TWL LRG LVL3 (GOWN DISPOSABLE) ×3
ILLUMINATOR WAVEGUIDE N/F (MISCELLANEOUS) IMPLANT
KIT BASIN OR (CUSTOM PROCEDURE TRAY) ×3 IMPLANT
KIT MARKER MARGIN INK (KITS) ×3 IMPLANT
LIGHT WAVEGUIDE WIDE FLAT (MISCELLANEOUS) ×2 IMPLANT
LIQUID BAND (GAUZE/BANDAGES/DRESSINGS) ×3 IMPLANT
NDL FILTER BLUNT 18X1 1/2 (NEEDLE) IMPLANT
NDL HYPO 25X1 1.5 SAFETY (NEEDLE) ×1 IMPLANT
NDL SAFETY ECLIPSE 18X1.5 (NEEDLE) IMPLANT
NEEDLE FILTER BLUNT 18X 1/2SAF (NEEDLE)
NEEDLE FILTER BLUNT 18X1 1/2 (NEEDLE) IMPLANT
NEEDLE HYPO 18GX1.5 SHARP (NEEDLE)
NEEDLE HYPO 25X1 1.5 SAFETY (NEEDLE) ×3 IMPLANT
NS IRRIG 1000ML POUR BTL (IV SOLUTION) ×3 IMPLANT
PACK SURGICAL SETUP 50X90 (CUSTOM PROCEDURE TRAY) ×3 IMPLANT
PAD ABD 8X10 STRL (GAUZE/BANDAGES/DRESSINGS) ×4 IMPLANT
PENCIL BUTTON HOLSTER BLD 10FT (ELECTRODE) ×3 IMPLANT
SPONGE GAUZE 4X4 12PLY STER LF (GAUZE/BANDAGES/DRESSINGS) ×2 IMPLANT
SPONGE LAP 18X18 X RAY DECT (DISPOSABLE) ×5 IMPLANT
STOCKINETTE IMPERVIOUS 9X36 MD (GAUZE/BANDAGES/DRESSINGS) ×3 IMPLANT
SUT MNCRL AB 4-0 PS2 18 (SUTURE) ×3 IMPLANT
SUT VIC AB 3-0 SH 18 (SUTURE) ×3 IMPLANT
SUT VIC AB 3-0 SH 27 (SUTURE) ×6
SUT VIC AB 3-0 SH 27X BRD (SUTURE) IMPLANT
SYR BULB 3OZ (MISCELLANEOUS) ×3 IMPLANT
SYR CONTROL 10ML LL (SYRINGE) ×3 IMPLANT
TOWEL OR 17X24 6PK STRL BLUE (TOWEL DISPOSABLE) ×3 IMPLANT
TOWEL OR 17X26 10 PK STRL BLUE (TOWEL DISPOSABLE) ×3 IMPLANT
TUBE CONNECTING 12'X1/4 (SUCTIONS) ×1
TUBE CONNECTING 12X1/4 (SUCTIONS) ×2 IMPLANT
YANKAUER SUCT BULB TIP NO VENT (SUCTIONS) ×3 IMPLANT

## 2015-10-09 NOTE — Interval H&P Note (Signed)
History and Physical Interval Note:  10/09/2015 9:24 AM  Maria Allen  has presented today for surgery, with the diagnosis of LEFT BREAST CANCER  The various methods of treatment have been discussed with the patient and family. After consideration of risks, benefits and other options for treatment, the patient has consented to  Procedure(s): BREAST LUMPECTOMY WITH RADIOACTIVE SEED AND SENTINEL LYMPH NODE BIOPSY (Left) as a surgical intervention .  The patient's history has been reviewed, patient examined, no change in status, stable for surgery.  I have reviewed the patient's chart and labs.  Questions were answered to the patient's satisfaction.     Vere Diantonio

## 2015-10-09 NOTE — Transfer of Care (Signed)
Immediate Anesthesia Transfer of Care Note  Patient: Maria Allen  Procedure(s) Performed: Procedure(s): BREAST LUMPECTOMY WITH RADIOACTIVE SEED AND SENTINEL LYMPH NODE BIOPSY (Left)  Patient Location: PACU  Anesthesia Type:General  Level of Consciousness: pateint uncooperative and confused  Airway & Oxygen Therapy: Patient Spontanous Breathing and Patient connected to nasal cannula oxygen  Post-op Assessment: Report given to RN and Post -op Vital signs reviewed and stable  Post vital signs: Reviewed and stable  Last Vitals:  Filed Vitals:   10/09/15 1030 10/09/15 1032  BP:  112/60  Pulse: 89 84  Temp:    Resp: 16 18    Last Pain: There were no vitals filed for this visit.       Complications: No apparent anesthesia complications

## 2015-10-09 NOTE — H&P (Signed)
Maria Allen  Location: [facility] Patient #: [id number] DOB: 1960/11/25 Undefined / Language: Maria Allen / Race: Black or African American Female   History of Present Illness  The patient is a 55 year old female who presents with breast cancer. Pt is a 55 yo F referred in consultation by Dr. Sharlet Salina for a new diagnosis of left breast cancer. She presented with screening detected asymmetry of the left breast. Diagnostic imaging showed a spiculated mass 8 mm on mammo, 9 mm on ultrasound. This was at 11 o'clock. 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 This was ER/PR positive, her 2 negative.   She is G0 with menarche at age 79. Menopause was in early 37s. She did take OCPs for a while. She has a family history of breast cancer in her paternal aunt.    dx mammo/ultrasound 08/27/2015 FINDINGS: In the medial left breast, posterior depth there is a spiculated mass measuring approximately 8 mm. No other suspicious calcifications, masses or areas of distortion are seen in the left breast.  Mammographic images were processed with CAD.  Physical exam of the upper inner left breast demonstrates no discrete palpable masses.  Ultrasound targeted to the left breast at 11 o'clock, 8 cm from the nipple demonstrates a hypoechoic shadowing spiculated mass measuring 9 x 8 x 5 mm. No abnormal lymph nodes were identified in the left axilla.  IMPRESSION: 1. There is a spiculated mass in the left breast at 11 o'clock which is suspicious for malignancy.  2. No evidence of left axillary lymphadenopathy.  pathology Diagnosis Breast, left, needle core biopsy, 11:00 o'clock - INVASIVE MAMMARY CARCINOMA. - MAMMARY CARCINOMA IN SITU. - SEE COMMENT. Microscopic Comment The carcinoma appears grade 1-2.  Labs CBC, CMET essentially normal.    Other Problems Anxiety Disorder General  anesthesia - complications Lump In Breast Migraine Headache  Past Surgical History Breast Biopsy Left.  Diagnostic Studies History Colonoscopy within last year Mammogram within last year Pap Smear 1-5 years ago  Social History Alcohol use Moderate alcohol use. No caffeine use No drug use Tobacco use Never smoker.  Family History Alcohol Abuse Father. Breast Cancer Family Members In General. Cerebrovascular Accident Father. Colon Cancer Family Members In General. Diabetes Mellitus Mother.  Pregnancy / Birth History Age at menarche 66 years. Age of menopause 51-55 Contraceptive History Oral contraceptives. Gravida 0    Review of Systems  General Present- Appetite Loss, Fatigue, Night Sweats, Weight Gain and Weight Loss. Not Present- Chills and Fever. Skin Present- Dryness and Rash. Not Present- Change in Wart/Mole, Hives, Jaundice, New Lesions, Non-Healing Wounds and Ulcer. HEENT Present- Seasonal Allergies and Wears glasses/contact lenses. Not Present- Earache, Hearing Loss, Hoarseness, Nose Bleed, Oral Ulcers, Ringing in the Ears, Sinus Pain, Sore Throat, Visual Disturbances and Yellow Eyes. Respiratory Not Present- Bloody sputum, Chronic Cough, Difficulty Breathing, Snoring and Wheezing. Breast Present- Breast Mass. Not Present- Breast Pain, Nipple Discharge and Skin Changes. Cardiovascular Not Present- Chest Pain, Difficulty Breathing Lying Down, Leg Cramps, Palpitations, Rapid Heart Rate, Shortness of Breath and Swelling of Extremities. Gastrointestinal Present- Constipation. Not Present- Abdominal Pain, Bloating, Bloody Stool, Change in Bowel Habits, Chronic diarrhea, Difficulty Swallowing, Excessive gas, Gets full quickly at meals, Hemorrhoids, Indigestion, Nausea, Rectal Pain and Vomiting. Female Genitourinary Not Present- Frequency, Nocturia, Painful Urination, Pelvic Pain and Urgency. Musculoskeletal Not Present- Back Pain, Joint Pain, Joint  Stiffness, Muscle Pain, Muscle Weakness and Swelling of Extremities. Neurological Not  Present- Decreased Memory, Fainting, Headaches, Numbness, Seizures, Tingling, Tremor, Trouble walking and Weakness. Psychiatric Present- Anxiety. Not Present- Bipolar, Change in Sleep Pattern, Depression, Fearful and Frequent crying. Endocrine Present- Hot flashes. Not Present- Cold Intolerance, Excessive Hunger, Hair Changes, Heat Intolerance and New Diabetes. Hematology Not Present- Easy Bruising, Excessive bleeding, Gland problems, HIV and Persistent Infections.  Vitals   Weight: 261.7 lb Height: 60in Body Surface Area: 2.09 m Body Mass Index: 51.11 kg/m  Temp.: 98.13F  Pulse: 83 (Regular)  Resp.: 18 (Unlabored)  BP: 133/86 (Sitting, Left Arm, Standard)       Physical Exam General Mental Status-Alert. General Appearance-Consistent with stated age. Hydration-Well hydrated. Voice-Normal.  Head and Neck Head-normocephalic, atraumatic with no lesions or palpable masses. Trachea-midline. Thyroid Gland Characteristics - normal size and consistency.  Eye Eyeball - Bilateral-Extraocular movements intact. Sclera/Conjunctiva - Bilateral-No scleral icterus.  Chest and Lung Exam Chest and lung exam reveals -quiet, even and easy respiratory effort with no use of accessory muscles and on auscultation, normal breath sounds, no adventitious sounds and normal vocal resonance. Inspection Chest Wall - Normal. Back - normal.  Breast Note: left breast sore in upper inner quadrant. No palpable mass or lymphadenopathy. no nipple retraction or discharge. left breast slighly larger. +Ptosis bilaterally   Cardiovascular Cardiovascular examination reveals -normal heart sounds, regular rate and rhythm with no murmurs and normal pedal pulses bilaterally.  Abdomen Inspection Inspection of the abdomen reveals - No Hernias. Palpation/Percussion Palpation and Percussion of  the abdomen reveal - Soft, Non Tender, No Rebound tenderness, No Rigidity (guarding) and No hepatosplenomegaly. Auscultation Auscultation of the abdomen reveals - Bowel sounds normal.  Neurologic Neurologic evaluation reveals -alert and oriented x 3 with no impairment of recent or remote memory. Mental Status-Normal.  Musculoskeletal Global Assessment -Note: no gross deformities.  Normal Exam - Left-Upper Extremity Strength Normal and Lower Extremity Strength Normal. Normal Exam - Right-Upper Extremity Strength Normal and Lower Extremity Strength Normal.  Lymphatic Head & Neck  General Head & Neck Lymphatics: Bilateral - Description - Normal. Axillary  General Axillary Region: Bilateral - Description - Normal. Tenderness - Non Tender. Femoral & Inguinal  Generalized Femoral & Inguinal Lymphatics: Bilateral - Description - No Generalized lymphadenopathy.    Assessment & Plan PRIMARY CANCER OF UPPER INNER QUADRANT OF LEFT FEMALE BREAST [id number]) Impression: We will get MRI due to lobular phenotype and dense breasts.  If negative, will do lumpectomy. She is a good candidate if mass is not significantly larger. She may get oncotype depending on final size of tumor. She will be recommended to get radiation if she has BCT. Also antiestrogen therapy will be recommended for chemoprevention.  We discussed lumpectomy with sentinel node biopsy today. The surgical procedure was described to the patient. I discussed the incision type and location and that we would need radiology involved on with a wire or seed marker and/or sentinel node.  The risks and benefits of the procedure were described to the patient and she wishes to proceed.  We discussed the risks bleeding, infection, damage to other structures, need for further procedures/surgeries. We discussed the risk of seroma. The patient was advised if the area in the breast in cancer, we may need to go back to surgery for  additional tissue to obtain negative margins or for a lymph node biopsy. The patient was advised that these are the most common complications, but that others can occur as well. They were advised against taking aspirin or other anti-inflammatory agents/blood thinners the week before  surgery.  45 min spent in evaluation, examination, counseling, and coordination of care. >50% spent in counseling.    Signed by Stark Klein, MD

## 2015-10-09 NOTE — Anesthesia Procedure Notes (Addendum)
Anesthesia Regional Block:  Pectoralis block  Pre-Anesthetic Checklist: ,, timeout performed, Correct Patient, Correct Site, Correct Laterality, Correct Procedure, Correct Position, site marked, Risks and benefits discussed,  Surgical consent,  Pre-op evaluation,  At surgeon's request and post-op pain management  Laterality: Left  Prep: chloraprep       Needles:  Injection technique: Single-shot  Needle Type: Echogenic Needle     Needle Length: 9cm 9 cm Needle Gauge: 21 and 21 G    Additional Needles:  Procedures: ultrasound guided (picture in chart) Pectoralis block Narrative:  Start time: 10/09/2015 9:55 AM End time: 10/09/2015 10:05 AM Injection made incrementally with aspirations every 5 mL.  Performed by: Personally  Anesthesiologist: Rod Mae   Procedure Name: Intubation Date/Time: 10/09/2015 11:28 AM Performed by: Merdis Delay Pre-anesthesia Checklist: Patient identified, Timeout performed, Emergency Drugs available, Suction available and Patient being monitored Patient Re-evaluated:Patient Re-evaluated prior to inductionOxygen Delivery Method: Circle system utilized Preoxygenation: Pre-oxygenation with 100% oxygen Intubation Type: IV induction LMA: LMA inserted LMA Size: 4.0 Number of attempts: 1 Placement Confirmation: positive ETCO2,  CO2 detector and breath sounds checked- equal and bilateral Tube secured with: Tape Dental Injury: Teeth and Oropharynx as per pre-operative assessment

## 2015-10-09 NOTE — Anesthesia Preprocedure Evaluation (Signed)
Anesthesia Evaluation  Patient identified by MRN, date of birth, ID band Patient awake    Reviewed: Allergy & Precautions, H&P , NPO status , Patient's Chart, lab work & pertinent test results  Airway Mallampati: II  TM Distance: >3 FB Neck ROM: full    Dental no notable dental hx. (+) Dental Advisory Given, Teeth Intact   Pulmonary neg pulmonary ROS,    Pulmonary exam normal breath sounds clear to auscultation       Cardiovascular Exercise Tolerance: Good negative cardio ROS Normal cardiovascular exam Rhythm:regular Rate:Normal     Neuro/Psych Anxiety negative neurological ROS  negative psych ROS   GI/Hepatic negative GI ROS, Neg liver ROS,   Endo/Other  Morbid obesity  Renal/GU negative Renal ROS  negative genitourinary   Musculoskeletal   Abdominal (+) + obese,   Peds  Hematology negative hematology ROS (+)   Anesthesia Other Findings   Reproductive/Obstetrics negative OB ROS                             Anesthesia Physical Anesthesia Plan  ASA: III  Anesthesia Plan: General   Post-op Pain Management: GA combined w/ Regional for post-op pain   Induction: Intravenous  Airway Management Planned: LMA  Additional Equipment:   Intra-op Plan:   Post-operative Plan:   Informed Consent: I have reviewed the patients History and Physical, chart, labs and discussed the procedure including the risks, benefits and alternatives for the proposed anesthesia with the patient or authorized representative who has indicated his/her understanding and acceptance.   Dental Advisory Given  Plan Discussed with: CRNA  Anesthesia Plan Comments:         Anesthesia Quick Evaluation

## 2015-10-09 NOTE — Op Note (Signed)
Left Breast Radioactive seed localized lumpectomy and sentinel lymph node biopsy  Indications: This patient presents with history of left breast cancer, upper inner quadrant, cT1bN0  Pre-operative Diagnosis: left breast cancer  Post-operative Diagnosis: same  Surgeon: BYERLY,FAERA   Anesthesia: General endotracheal anesthesia  ASA Class: 3  Procedure Details  The patient was seen in the Holding Room. The risks, benefits, complications, treatment options, and expected outcomes were discussed with the patient. The possibilities of bleeding, infection, the need for additional procedures, failure to diagnose a condition, and creating a complication requiring transfusion or operation were discussed with the patient. The patient concurred with the proposed plan, giving informed consent.  The site of surgery properly noted/marked. The patient was taken to Operating Room # 2, identified, and the procedure verified as left Breast seed localized Lumpectomy with sentinel lymph node biopsy. A Time Out was held and the above information confirmed.  The left arm, breast, and chest were prepped and draped in standard fashion. The lumpectomy was performed by creating an transverse incision over the upper inner quadrant of the breast over the previously placed radioactive seed.  Dissection was carried down to around the point of maximum signal intensity. The cautery was used to perform the dissection.  Hemostasis was achieved with cautery. The edges of the cavity were marked with large clips, with one each medial, lateral, inferior and superior, and two clips posteriorly.   The specimen was inked with the margin marker paint kit.    Specimen radiography confirmed inclusion of the mammographic lesion, the clip, and the seed.  The background signal in the breast was zero.  The wound was irrigated and closed with 3-0 vicryl in layers and 4-0 monocryl subcuticular suture.    Using a hand-held gamma probe, left  axillary sentinel nodes were identified transcutaneously.  An oblique incision was created below the axillary hairline.  Dissection was carried through the clavipectoral fascia.  Three level 2 axillary sentinel nodes were removed.  Counts per second were 901, 290, and 70.    The background count was 11 cps.  The wound was irrigated.  Hemostasis was achieved with cautery.  The axillary incision was closed with a 3-0 vicryl deep dermal interrupted sutures and a 4-0 monocryl subcuticular closure.    Sterile dressings were applied. At the end of the operation, all sponge, instrument, and needle counts were correct.  Findings: grossly clear surgical margins and no adenopathy, posterior margin is pectoralis  Estimated Blood Loss:  min         Specimens: left breast lumpectomy and three left axillary sentinel lymph nodes.             Complications:  None; patient tolerated the procedure well.         Disposition: PACU - hemodynamically stable.         Condition: stable  

## 2015-10-09 NOTE — Discharge Instructions (Signed)
[facility] °Office Phone Number [phone] ° °BREAST BIOPSY/ PARTIAL MASTECTOMY: POST OP INSTRUCTIONS ° °Always review your discharge instruction sheet given to you by the facility where your surgery was performed. ° °IF YOU HAVE DISABILITY OR FAMILY LEAVE FORMS, YOU MUST BRING THEM TO THE OFFICE FOR PROCESSING.  DO NOT GIVE THEM TO YOUR DOCTOR. ° °1. A prescription for pain medication may be given to you upon discharge.  Take your pain medication as prescribed, if needed.  If narcotic pain medicine is not needed, then you may take acetaminophen (Tylenol) or ibuprofen (Advil) as needed. °2. Take your usually prescribed medications unless otherwise directed °3. If you need a refill on your pain medication, please contact your pharmacy.  They will contact our office to request authorization.  Prescriptions will not be filled after 5pm or on week-ends. °4. You should eat very light the first 24 hours after surgery, such as soup, crackers, pudding, etc.  Resume your normal diet the day after surgery. °5. Most patients will experience some swelling and bruising in the breast.  Ice packs and a good support bra will help.  Swelling and bruising can take several days to resolve.  °6. It is common to experience some constipation if taking pain medication after surgery.  Increasing fluid intake and taking a stool softener will usually help or prevent this problem from occurring.  A mild laxative (Milk of Magnesia or Miralax) should be taken according to package directions if there are no bowel movements after 48 hours. °7. Unless discharge instructions indicate otherwise, you may remove your bandages 48 hours after surgery, and you may shower at that time.  You may have steri-strips (small skin tapes) in place directly over the incision.  These strips should be left on the skin for 7-10 days.   Any sutures or staples will be removed at the office during your follow-up visit. °8. ACTIVITIES:  You may resume  regular daily activities (gradually increasing) beginning the next day.  Wearing a good support bra or sports bra (or the breast binder) minimizes pain and swelling.  You may have sexual intercourse when it is comfortable. °a. You may drive when you no longer are taking prescription pain medication, you can comfortably wear a seatbelt, and you can safely maneuver your car and apply brakes. °b. RETURN TO WORK:  __________1 week_______________ °9. You should see your doctor in the office for a follow-up appointment approximately two weeks after your surgery.  Your doctor’s nurse will typically make your follow-up appointment when she calls you with your pathology report.  Expect your pathology report 2-3 business days after your surgery.  You may call to check if you do not hear from us after three days. ° ° °WHEN TO CALL YOUR DOCTOR: °1. Fever over 101.0 °2. Nausea and/or vomiting. °3. Extreme swelling or bruising. °4. Continued bleeding from incision. °5. Increased pain, redness, or drainage from the incision. ° °The clinic staff is available to answer your questions during regular business hours.  Please don’t hesitate to call and ask to speak to one of the nurses for clinical concerns.  If you have a medical emergency, go to the nearest emergency room or call 911.  A surgeon from [facility] is always on call at the hospital. ° °For further questions, please visit [email or web address]  ° °

## 2015-10-10 ENCOUNTER — Encounter (HOSPITAL_COMMUNITY): Payer: Self-pay | Admitting: General Surgery

## 2015-10-10 NOTE — Anesthesia Postprocedure Evaluation (Signed)
Anesthesia Post Note  Patient: Maria Allen  Procedure(s) Performed: Procedure(s) (LRB): BREAST LUMPECTOMY WITH RADIOACTIVE SEED AND SENTINEL LYMPH NODE BIOPSY (Left)  Patient location during evaluation: PACU Anesthesia Type: General and Regional Level of consciousness: awake and alert Pain management: pain level controlled Vital Signs Assessment: post-procedure vital signs reviewed and stable Respiratory status: spontaneous breathing, nonlabored ventilation, respiratory function stable and patient connected to nasal cannula oxygen Cardiovascular status: blood pressure returned to baseline and stable Postop Assessment: no signs of nausea or vomiting Anesthetic complications: no    Last Vitals:  Filed Vitals:   10/09/15 1410 10/09/15 1422  BP: 157/100 148/91  Pulse: 66 65  Temp:    Resp: 15     Last Pain:  Filed Vitals:   10/09/15 1426  PainSc: 0-No pain                 Catalina Gravel

## 2015-10-12 ENCOUNTER — Encounter: Payer: Self-pay | Admitting: *Deleted

## 2015-10-12 NOTE — Progress Notes (Signed)
[place] Work  Clinical Social Work submitted [employer] [place] application and has been working with pt to complete [employer]. Pt made aware to come by [place] office on 6/13 to sign and date application. Pt is self employed and may need additional documentation to prove this to be true. CSW to continue to follow and assist.   Clinical Social Work interventions: Resource assistance  Loren Racer, [place] Worker [place]  [place] Phone: [phone] Fax: [phone]

## 2015-10-15 NOTE — Progress Notes (Signed)
Quick Note:  Please let patient know that margins and LN are negative. ______

## 2015-10-16 ENCOUNTER — Ambulatory Visit (HOSPITAL_BASED_OUTPATIENT_CLINIC_OR_DEPARTMENT_OTHER): Payer: BLUE CROSS/BLUE SHIELD | Admitting: Hematology and Oncology

## 2015-10-16 ENCOUNTER — Telehealth: Payer: Self-pay | Admitting: *Deleted

## 2015-10-16 ENCOUNTER — Encounter: Payer: Self-pay | Admitting: Hematology and Oncology

## 2015-10-16 VITALS — BP 139/84 | HR 100 | Temp 98.5°F | Resp 19 | Ht 60.0 in | Wt 261.8 lb

## 2015-10-16 DIAGNOSIS — C50212 Malignant neoplasm of upper-inner quadrant of left female breast: Secondary | ICD-10-CM

## 2015-10-16 NOTE — Telephone Encounter (Signed)
Ordered Oncotype Dx test per MD.  Faxed order to [facility] and called Keisha to verify she received it.  Faxed order to [facility].  Faxed order to [facility].  Placed a note to f/u for results.

## 2015-10-16 NOTE — Progress Notes (Signed)
Patient Care Team: Hoyt Koch, MD as PCP - General (Internal Medicine)  DIAGNOSIS: Breast cancer of upper-inner quadrant of left female breast [facility])   Staging form: Breast, AJCC 7th Edition     Clinical stage from 09/05/2015: Stage IA (T1b, N0, M0) - Unsigned       Staging comments: Staged at breast conference on 5.3.17  SUMMARY OF ONCOLOGIC HISTORY:   Breast cancer of upper-inner quadrant of left female breast ([place])   08/27/2015 Initial Diagnosis Left breast biopsy 11:00: Invasive lobular cancer with LCIS, grade 1,ER 80%, PR 95%, Ki-67 10%, HER-2 Negativeratio 0.97, T1 BN 0 stage I a clinical stage   08/27/2015 Mammogram Screening detected left breast asymmetry posteriorly at 11:00 position 9 x 8 x 5 mm by ultrasound, axilla negative   10/09/2015 Surgery Left lumpectomy: Invasive lobular carcinoma grade 1, 0.9 cm, ALH, margins negative, 0/3 lymph nodes negative, T1bN0 stage IA, ER 80%, PR 95%, HER-2 negative ratio 0.97, Ki-67 10%    CHIEF COMPLIANT: Follow-up after recent lumpectomy  INTERVAL HISTORY: SANAIA JASSO is a 55 year old with above-mentioned history of left breast invasive lobular cancer and underwent lumpectomy and is here for follow-up. She is healing very well from the surgery. She reports no major problems of low mild discomfort under the arm.  REVIEW OF SYSTEMS:   Constitutional: Denies fevers, chills or abnormal weight loss Eyes: Denies blurriness of vision Ears, nose, mouth, throat, and face: Denies mucositis or sore throat Respiratory: Denies cough, dyspnea or wheezes Cardiovascular: Denies palpitation, chest discomfort Gastrointestinal:  Denies nausea, heartburn or change in bowel habits Skin: Denies abnormal skin rashes Lymphatics: Denies new lymphadenopathy or easy bruising Neurological:Denies numbness, tingling or new weaknesses Behavioral/Psych: Mood is stable, no new changes  Extremities: No lower extremity edema Breast:  denies any pain or lumps or  nodules in either breasts All other systems were reviewed with the patient and are negative.  I have reviewed the past medical history, past surgical history, social history and family history with the patient and they are unchanged from previous note.  ALLERGIES:  is allergic to gadolinium derivatives.  MEDICATIONS:  Current Outpatient Prescriptions  Medication Sig Dispense Refill  . aspirin 325 MG tablet Take 650 mg by mouth daily as needed for moderate pain or headache. Reported on 09/05/2015    . oxyCODONE-acetaminophen (ROXICET) 5-325 MG tablet Take 1-2 tablets by mouth every 4 (four) hours as needed for severe pain. 30 tablet 0  . phentermine 37.5 MG capsule Take 37.5 mg by mouth every morning.     No current facility-administered medications for this visit.    PHYSICAL EXAMINATION: ECOG PERFORMANCE STATUS: 1 - Symptomatic but completely ambulatory  Filed Vitals:   10/16/15 0833  BP: 139/84  Pulse: 100  Temp: 98.5 F (36.9 C)  Resp: 19   Filed Weights   10/16/15 0833  Weight: 261 lb 12.8 oz (118.752 kg)    GENERAL:alert, no distress and comfortable SKIN: skin color, texture, turgor are normal, no rashes or significant lesions EYES: normal, Conjunctiva are pink and non-injected, sclera clear OROPHARYNX:no exudate, no erythema and lips, buccal mucosa, and tongue normal  NECK: supple, thyroid normal size, non-tender, without nodularity LYMPH:  no palpable lymphadenopathy in the cervical, axillary or inguinal LUNGS: clear to auscultation and percussion with normal breathing effort HEART: regular rate & rhythm and no murmurs and no lower extremity edema ABDOMEN:abdomen soft, non-tender and normal bowel sounds MUSCULOSKELETAL:no cyanosis of digits and no clubbing  NEURO: alert & oriented  x 3 with fluent speech, no focal motor/sensory deficits EXTREMITIES: No lower extremity edema  LABORATORY DATA:  I have reviewed the data as listed   Chemistry      Component Value  Date/Time   NA 139 09/05/2015 1302   NA 138 08/02/2014 0929   K 4.2 09/05/2015 1302   K 4.0 08/02/2014 0929   CL 106 08/02/2014 0929   CO2 26 09/05/2015 1302   CO2 29 08/02/2014 0929   BUN 16.6 09/05/2015 1302   BUN 12 08/02/2014 0929   CREATININE 0.9 09/05/2015 1302   CREATININE 0.71 08/02/2014 0929      Component Value Date/Time   CALCIUM 10.2 09/05/2015 1302   CALCIUM 9.5 08/02/2014 0929   ALKPHOS 58 09/05/2015 1302   ALKPHOS 63 08/02/2014 0929   AST 18 09/05/2015 1302   AST 16 08/02/2014 0929   ALT 13 09/05/2015 1302   ALT 12 08/02/2014 0929   BILITOT 0.51 09/05/2015 1302   BILITOT 0.3 08/02/2014 0929       Lab Results  Component Value Date   WBC 5.8 10/04/2015   HGB 13.4 10/04/2015   HCT 42.6 10/04/2015   MCV 89.3 10/04/2015   PLT 347 10/04/2015   NEUTROABS 2.2 09/05/2015     ASSESSMENT & PLAN:  Breast cancer of upper-inner quadrant of left female breast (HCC) Left lumpectomy: Invasive lobular carcinoma grade 1, 0.9 cm, ALH, margins negative, 0/3 lymph nodes negative, T1bN0 stage IA, ER 80%, PR 95%, HER-2 negative ratio 0.97, Ki-67 10%  Pathology counseling: I discussed the final pathology report of the patient provided  a copy of this report. I discussed the margins as well as lymph node surgeries. We also discussed the final staging along with previously performed ER/PR and HER-2/neu testing.  Treatment plan: 1. Oncotype DX testing to determine if chemotherapy would be of any benefit followed by 2. Adjuvant radiation therapy followed by 3. Adjuvant antiestrogen therapy  Return to clinic based upon Oncotype DX test result.    No orders of the defined types were placed in this encounter.   The patient has a good understanding of the overall plan. she agrees with it. she will call with any problems that may develop before the next visit here.   Rulon Eisenmenger, MD 10/16/2015

## 2015-10-16 NOTE — Assessment & Plan Note (Signed)
Left lumpectomy: Invasive lobular carcinoma grade 1, 0.9 cm, ALH, margins negative, 0/3 lymph nodes negative, T1bN0 stage IA, ER 80%, PR 95%, HER-2 negative ratio 0.97, Ki-67 10%  Pathology counseling: I discussed the final pathology report of the patient provided  a copy of this report. I discussed the margins as well as lymph node surgeries. We also discussed the final staging along with previously performed ER/PR and HER-2/neu testing.  Treatment plan: 1. Oncotype DX testing to determine if chemotherapy would be of any benefit followed by 2. Adjuvant radiation therapy followed by 3. Adjuvant antiestrogen therapy  Return to clinic based upon Oncotype DX test result.

## 2015-10-29 ENCOUNTER — Encounter (HOSPITAL_COMMUNITY): Payer: Self-pay

## 2015-10-30 ENCOUNTER — Telehealth: Payer: Self-pay | Admitting: *Deleted

## 2015-10-30 ENCOUNTER — Telehealth: Payer: Self-pay

## 2015-10-30 NOTE — Telephone Encounter (Signed)
FYI call from [place] that is in enrolled in the program.

## 2015-10-30 NOTE — Telephone Encounter (Signed)
Spoke with patient to inform her of the oncotype results.  Per Dr. Lindi Adie she does not need chemo.  Informed her she would get a call from Dr. Ida Rogue office.

## 2015-10-30 NOTE — Telephone Encounter (Signed)
Received Oncotype Dx score of 21/14%.  Placed a copy in Dr. Geralyn Flash box, gave Varney Biles a copy and took a copy to HIM to scan.

## 2015-11-08 ENCOUNTER — Encounter: Payer: Self-pay | Admitting: *Deleted

## 2015-11-08 NOTE — Progress Notes (Signed)
[place] Work  Clinical Social Work was referred by patient for assessment of psychosocial needs.  Pt dropped off paperwork for [employer] in [place]. Clinical Social Worker contacted [employer] in [place] to inquire about patient's application. Per Geni Bers at [facility] in [place], Pt's application is in the process of being submitted for medical review by the medical review board for approval. CSW notified pt of status and pt was appreciative for update. CSW will continue to follow and assist accordingly.      Clinical Social Work interventions: Pt advocacy  Loren Racer, [place] Worker [place]  [place] Phone: [phone] Fax: [phone]

## 2015-11-09 ENCOUNTER — Encounter: Payer: Self-pay | Admitting: Radiation Oncology

## 2015-11-09 NOTE — Progress Notes (Signed)
Location of Breast Cancer: Left Breast Upper inner quadrant ( T1 BN  stage 1 a)  Histology per Pathology Report: Diagnosis 08/27/2015 : Breast, left, needle core biopsy, 11:00 o'clock- INVASIVE MAMMARY CARCINOMA.- MAMMARY CARCINOMA IN SITU  Receptor Status: ER(80%+), PR (95%+), Her2-neu (neg. Ratio 0.97), Ki-( 10%)  Did patient present with symptoms (if so, please note symptoms) or was this found on screening mammography?: Routine screening  Past/Anticipated interventions by surgeon, if any:Dr. Byerly,Faera,MD Diagnosis 10/09/2015: 1. Breast, lumpectomy, Left - INVASIVE LOBULAR CARCINOMA, GRADE I/III, SPANNING 0.9 CM.- ATYPICAL LOBULAR HYPERPLASIA.- THE SURGICAL RESECTION MARGINS ARE NEGATIVE FOR CARCINOMA.- SEE ONCOLOGY TABLE BELOW. 2. Lymph node, sentinel, biopsy, Left Axillary #1 - THERE IS NO EVIDENCE OF CARCINOMA IN 1 OF 1 LYMPH NODE (0/1). 3. Lymph node, sentinel, biopsy, Left Axillary #2 - THERE IS NO EVIDENCE OF CARCINOMA IN 1 OF 1 LYMPH NODE (0/1). 4. Lymph node, sentinel, biopsy, Left Axillary #3 - THERE IS NO EVIDENCE OF CARCINOMA IN 1 OF 1 LYMPH NODE (0/1).  Past/Anticipated interventions by medical oncology, if any: Chemotherapy :Dr. Gudena 10/16/15 Oncotype result=21,  Lymphedema issues, if any:  None  Pain issues, if any: None  SAFETY ISSUES:  Prior radiation?  NO  Pacemaker/ICD? NO  Possible current pregnancy? NO  Is the patient on methotrexate? NO  Current Complaints / other details:  Menarche age 13, G0P0, oral contraceptives - intermittently from age 18 - 40. No HRT    Paternal aunt breast cancer, Father CVA, Mother DM,     [place], Janice Bruner, RN 11/09/2015,2:25 PM  Allergies: Gadolinium derivitives 

## 2015-11-12 ENCOUNTER — Ambulatory Visit
Admission: RE | Admit: 2015-11-12 | Discharge: 2015-11-12 | Disposition: A | Discharge status: Home / Self Care | Payer: BLUE CROSS/BLUE SHIELD | Source: Ambulatory Visit | Attending: Radiation Oncology | Admitting: Radiation Oncology

## 2015-11-12 ENCOUNTER — Encounter: Payer: Self-pay | Admitting: Radiation Oncology

## 2015-11-12 VITALS — BP 138/64 | HR 95 | Temp 98.2°F | Ht 60.0 in | Wt 261.5 lb

## 2015-11-12 DIAGNOSIS — Z7982 Long term (current) use of aspirin: Secondary | ICD-10-CM | POA: Insufficient documentation

## 2015-11-12 DIAGNOSIS — Z923 Personal history of irradiation: Secondary | ICD-10-CM | POA: Insufficient documentation

## 2015-11-12 DIAGNOSIS — Z79899 Other long term (current) drug therapy: Secondary | ICD-10-CM | POA: Insufficient documentation

## 2015-11-12 DIAGNOSIS — Z510 Encounter for antineoplastic radiation therapy: Secondary | ICD-10-CM | POA: Diagnosis present

## 2015-11-12 DIAGNOSIS — Z170 Estrogen receptor positive status [ER+]: Secondary | ICD-10-CM | POA: Diagnosis not present

## 2015-11-12 DIAGNOSIS — Z888 Allergy status to other drugs, medicaments and biological substances status: Secondary | ICD-10-CM | POA: Insufficient documentation

## 2015-11-12 DIAGNOSIS — C50212 Malignant neoplasm of upper-inner quadrant of left female breast: Secondary | ICD-10-CM | POA: Insufficient documentation

## 2015-11-12 NOTE — Progress Notes (Signed)
Radiation Oncology         [phone] ________________________________  Name: Maria Allen MRN: [id number]  Date: 11/12/2015  DOB: [birth year]  Follow-Up Visit Note  CC: Hoyt Koch, MD  Stark Klein, MD  Diagnosis: Stage IA T1bN0 invasive lobular carcinoma (ER/PR positive, HER2 negative) of the left breast  Interval Since Last Radiation: N/A  Narrative:  The patient returns today for routine follow-up. The patient was initially seen in breast clinic on 09/05/15. She had a left lumpectomy and lefty axillary sentinel lymph node biopsy on 10/09/15. This revealed a grade 1 invasive lobular carcinoma measuring 0.9 cm (ER/PR positive, HER2 negative), negative margins, ALH, and 0/3 lymph nodes were negative. Her Oncotype DX score was 21; intermediate risk. Dr. Lindi Adie did not recommend chemotherapy. The patient presents today to discuss the role of radiation in the management of her disease.  Review of Systems: On review of systems, the patient reports that she is doing well overall. She denies any chest pain, shortness of breath, cough, fevers, chills, night sweats, unintended weight changes. She denies any bowel or bladder disturbances, and denies abdominal pain, nausea or vomiting. She denies any new musculoskeletal or joint aches or pains. A complete review of systems is obtained and is otherwise negative.  Past Medical History:  Past Medical History  Diagnosis Date  . Migraine   . Breast cancer of upper-inner quadrant of left female breast ([place]) 08/29/2015  . Anxiety   . Breast cancer ([place]) 08/27/15    left    Past Surgical History: Past Surgical History  Procedure Laterality Date  . No past surgeries    . Colonoscopy with propofol N/A 11/13/2014    Procedure: COLONOSCOPY WITH PROPOFOL;  Surgeon: Jerene Bears, MD;  Location: [facility];  Service: Gastroenterology;  Laterality: N/A;  . Breast lumpectomy with radioactive seed and sentinel lymph node biopsy Left 10/09/2015   Procedure: BREAST LUMPECTOMY WITH RADIOACTIVE SEED AND SENTINEL LYMPH NODE BIOPSY;  Surgeon: Stark Klein, MD;  Location: [place] OR;  Service: General;  Laterality: Left;    Social History:  Social History   Social History  . Marital Status: Single    Spouse Name: N/A  . Number of Children: N/A  . Years of Education: N/A   Occupational History  . Not on file.   Social History Main Topics  . Smoking status: Never Smoker   . Smokeless tobacco: Never Used  . Alcohol Use: No  . Drug Use: No  . Sexual Activity: Not on file   Other Topics Concern  . Not on file   Social History Narrative    Family History: Family History  Problem Relation Age of Onset  . Diabetes Mother   . Alcohol abuse Father   . Stroke Father   . Mental illness Father   . Colon cancer Neg Hx   . Colon polyps Neg Hx   . Rectal cancer Neg Hx   . Stomach cancer Neg Hx   . Breast cancer Paternal Aunt     ALLERGIES:  is allergic to gadolinium derivatives.  Meds: Current Outpatient Prescriptions  Medication Sig Dispense Refill  . aspirin 325 MG tablet Take 650 mg by mouth daily as needed for moderate pain or headache. Reported on 09/05/2015    . phentermine 37.5 MG capsule Take 37.5 mg by mouth every morning.     No current facility-administered medications for this encounter.    Physical Findings:  height is 5' (1.524 m) and weight is 261  lb 8 oz (118.616 kg). Her temperature is 98.2 F (36.8 C). Her blood pressure is 138/64 and her pulse is 95. .   In general this is a well appearing African-American woman in no acute distress. She is alert and oriented x4 and appropriate throughout the examination. Cardiopulmonary assessment is negative for acute distress and she exhibits normal effort. Her left breast incision has healed well without erythema. Lymphatic assessment is performed and does not reveal any adenopathy in the cervical, supraclavicular, axillary, or inguinal chains. Abdomen has active bowel sounds  in all quadrants and is intact. The abdomen is soft, non tender, non distended. Lower extremities are negative for pretibial pitting edema, deep calf tenderness, cyanosis or clubbing.  Lab Findings: Lab Results  Component Value Date   WBC 5.8 10/04/2015   HGB 13.4 10/04/2015   HCT 42.6 10/04/2015   MCV 89.3 10/04/2015   PLT 347 10/04/2015     Radiographic Findings: No results found.  Impression: Stage IA T1bN0 invasive lobular carcinoma (ER/PR positive, HER2 negative) of the left breast  Plan: Dr. Lisbeth Renshaw met with tthe patient today regarding her diagnosis and options for treatment.  We discussed the role of radiation in decreasing local failures in patients who undergo lumpectomy. We discussed the process of CT simulation and the placement tattoos. We discussed 33 fractions of treatment over 6 1/2 weeks as an outpatient. We discussed the possibility of asymptomatic lung damage. We discussed the low likelihood of secondary malignancies. We discussed the possible side effects including but not limited to skin redness, fatigue, permanent skin darkening, and breast swelling.  We discussed the use of cardiac sparing with deep inspiration breath hold as well. The patient agrees to proceed with radiation treatment. CT simulation is scheduled this Wednesday at [facility]. The patient provided [place] with paperwork concerning cancer disability.  The above documentation reflects my direct findings during this shared patient visit. Please see the separate note by Dr. Lisbeth Renshaw on this date for the remainder of the patient's plan of care.    Carola Rhine, PAC  This document serves as a record of services personally performed by Shona Simpson, PA-C and Kyung Rudd, MD. It was created on their behalf by Darcus Austin, a trained medical scribe. The creation of this record is based on the scribe's personal observations and the providers' statements to them. This document has been checked and approved by the attending  provider.

## 2015-11-14 ENCOUNTER — Ambulatory Visit
Admission: RE | Admit: 2015-11-14 | Discharge: 2015-11-14 | Disposition: A | Discharge status: Home / Self Care | Payer: BLUE CROSS/BLUE SHIELD | Source: Ambulatory Visit | Attending: Radiation Oncology | Admitting: Radiation Oncology

## 2015-11-14 DIAGNOSIS — Z510 Encounter for antineoplastic radiation therapy: Secondary | ICD-10-CM | POA: Diagnosis not present

## 2015-11-14 DIAGNOSIS — C50212 Malignant neoplasm of upper-inner quadrant of left female breast: Secondary | ICD-10-CM

## 2015-11-15 ENCOUNTER — Other Ambulatory Visit: Payer: Self-pay | Admitting: *Deleted

## 2015-11-20 DIAGNOSIS — Z510 Encounter for antineoplastic radiation therapy: Secondary | ICD-10-CM | POA: Diagnosis not present

## 2015-11-21 ENCOUNTER — Ambulatory Visit
Admission: RE | Admit: 2015-11-21 | Discharge: 2015-11-21 | Disposition: A | Discharge status: Home / Self Care | Payer: BLUE CROSS/BLUE SHIELD | Source: Ambulatory Visit | Attending: Radiation Oncology | Admitting: Radiation Oncology

## 2015-11-21 DIAGNOSIS — Z510 Encounter for antineoplastic radiation therapy: Secondary | ICD-10-CM | POA: Diagnosis not present

## 2015-11-21 DIAGNOSIS — C50212 Malignant neoplasm of upper-inner quadrant of left female breast: Secondary | ICD-10-CM

## 2015-11-21 MED ORDER — RADIAPLEXRX EX GEL
Freq: Once | CUTANEOUS | Status: AC
  Administered 2015-11-21: 09:00:00 via TOPICAL

## 2015-11-21 MED ORDER — ALRA NON-METALLIC DEODORANT (RAD-ONC)
1.0000 "application " | Freq: Once | TOPICAL | Status: AC
  Administered 2015-11-21: 1 via TOPICAL

## 2015-11-21 NOTE — Progress Notes (Signed)
Pt education done, my business card, Radiation therapy and you book, alra and radiaplex  Skin products given to the patient , discussed ways to manage side effects, swelling of breast,skin irritation,pain, fatigue, use of electric shaver if needed,  Use skin products after rad tx and bedtime, increase protein in diet, stay hydrated,drink plenty fluids, verbal understanding, teach back given 9:12 AM

## 2015-11-22 ENCOUNTER — Ambulatory Visit
Admission: RE | Admit: 2015-11-22 | Discharge: 2015-11-22 | Disposition: A | Discharge status: Home / Self Care | Payer: BLUE CROSS/BLUE SHIELD | Source: Ambulatory Visit | Attending: Radiation Oncology | Admitting: Radiation Oncology

## 2015-11-22 DIAGNOSIS — Z510 Encounter for antineoplastic radiation therapy: Secondary | ICD-10-CM | POA: Diagnosis not present

## 2015-11-23 ENCOUNTER — Ambulatory Visit
Admission: RE | Admit: 2015-11-23 | Discharge: 2015-11-23 | Disposition: A | Discharge status: Home / Self Care | Payer: BLUE CROSS/BLUE SHIELD | Source: Ambulatory Visit | Attending: Radiation Oncology | Admitting: Radiation Oncology

## 2015-11-23 ENCOUNTER — Encounter: Payer: Self-pay | Admitting: Radiation Oncology

## 2015-11-23 ENCOUNTER — Encounter: Payer: Self-pay | Admitting: *Deleted

## 2015-11-23 VITALS — BP 131/75 | HR 92 | Temp 98.7°F | Resp 16 | Wt 261.0 lb

## 2015-11-23 DIAGNOSIS — Z510 Encounter for antineoplastic radiation therapy: Secondary | ICD-10-CM | POA: Diagnosis not present

## 2015-11-23 DIAGNOSIS — C50212 Malignant neoplasm of upper-inner quadrant of left female breast: Secondary | ICD-10-CM

## 2015-11-23 NOTE — Progress Notes (Signed)
[place] Psychosocial Distress Screening Clinical Social Work  Clinical Social Work was referred by distress screening protocol.  The patient scored a 6 on the Psychosocial Distress Thermometer which indicates moderate distress. Clinical Social Worker phoned pt at home to assess for distress and other psychosocial needs. CSW has been following for financial assistance and support. Pt reports she has many bills and forms from insurance that insurance has denied. She plans to contact to insurance case manager for assistance. Pt reports to be doing well since she started radiation. She is well aware of support resources and continues to be in regular contact with CSW. [place] decision is pending and pt is hopeful for a few other assistance organizations coming through. Apps pending. CSW will continue to follow and pt will reach out soon.   ONCBCN DISTRESS SCREENING 11/12/2015  Screening Type   Distress experienced in past week (1-10) 6  Practical problem type Housing;Work/school;Food  Emotional problem type Adjusting to illness;Adjusting to appearance changes  Spiritual/Religous concerns type Relating to God  Information Concerns Type Lack of info about treatment;Lack of info about complementary therapy choices  Physical Problem type Constipation/diarrhea;Tingling hands/feet  Physician notified of physical symptoms Yes  Referral to clinical social work Yes  Referral to support programs     Clinical Social Worker follow up needed: Yes  If yes, follow up plan: See above Loren Racer, [place]  [facility] Phone: [phone] Fax: [phone]

## 2015-11-23 NOTE — Progress Notes (Signed)
Weekly rad txs left breast 2/33 completed, no skin changes, pt education done the other day  Using radaiplex bid,  No c/o pain or discomfort 9:16 AM   Wt Readings from Last 3 Encounters:  11/12/15 261 lb 8 oz (118.616 kg)  10/16/15 261 lb 12.8 oz (118.752 kg)  10/09/15 263 lb 4.8 oz (119.432 kg)

## 2015-11-24 NOTE — Progress Notes (Signed)
   Department of Radiation Oncology  Phone:  [phone] Fax:        [phone]  Weekly Treatment Note    Name: STEVY ROMIG Date: 11/24/2015 MRN: [id number] DOB: 09/06/1960   Diagnosis:     ICD-9-CM ICD-10-CM   1. Breast cancer of upper-inner quadrant of left female breast (HCC) 174.2 C50.212      Current dose: 3.6 Gy  Current fraction: 2   MEDICATIONS: Current Outpatient Prescriptions  Medication Sig Dispense Refill  . aspirin 325 MG tablet Take 650 mg by mouth daily as needed for moderate pain or headache. Reported on 09/05/2015    . hyaluronate sodium (RADIAPLEXRX) GEL Apply 1 application topically once.    . non-metallic deodorant Jethro Poling) MISC Apply 1 application topically daily as needed.    . phentermine 37.5 MG capsule Take 37.5 mg by mouth every morning.     No current facility-administered medications for this encounter.     ALLERGIES: Gadolinium derivatives   LABORATORY DATA:  Lab Results  Component Value Date   WBC 5.8 10/04/2015   HGB 13.4 10/04/2015   HCT 42.6 10/04/2015   MCV 89.3 10/04/2015   PLT 347 10/04/2015   Lab Results  Component Value Date   NA 139 09/05/2015   K 4.2 09/05/2015   CL 106 08/02/2014   CO2 26 09/05/2015   Lab Results  Component Value Date   ALT 13 09/05/2015   AST 18 09/05/2015   ALKPHOS 58 09/05/2015   BILITOT 0.51 09/05/2015     NARRATIVE: Maria Allen was seen today for weekly treatment management. The chart was checked and the patient's films were reviewed.  Weekly rad txs left breast 2/33 completed, no skin changes, pt education done the other day  Using radaiplex bid,  No c/o pain or discomfort 1:15 PM   Wt Readings from Last 3 Encounters:  11/23/15 261 lb (118.389 kg)  11/12/15 261 lb 8 oz (118.616 kg)  10/16/15 261 lb 12.8 oz (118.752 kg)     PHYSICAL EXAMINATION: weight is 261 lb (118.389 kg). Her oral temperature is 98.7 F (37.1 C). Her blood pressure is 131/75 and her pulse is 92. Her  respiration is 16.        ASSESSMENT: The patient is doing satisfactorily with treatment.  PLAN: We will continue with the patient's radiation treatment as planned.

## 2015-11-24 NOTE — Progress Notes (Signed)
  Radiation Oncology         [phone] ________________________________  Name: JEYDI RAMPTON MRN: [id number]  Date: 11/14/2015  DOB: 16-Dec-1960  DIAGNOSIS:     ICD-9-CM ICD-10-CM   1. Breast cancer of upper-inner quadrant of left female breast ([place]) 174.2 C50.212      SIMULATION AND TREATMENT PLANNING NOTE  The patient presented for simulation prior to beginning her course of radiation treatment for her diagnosis of Left-sided breast cancer. The patient was placed in a supine position on a breast board. A customized vac-lock bag was constructed and this complex treatment device will be used on a daily basis during her treatment. In this fashion, a CT scan was obtained through the chest area and an isocenter was placed near the chest wall within the breast.  The patient will be planned to receive a course of radiation initially to a dose of 50.4 Gy. This will consist of a whole breast radiotherapy technique. To accomplish this, 2 customized blocks have been designed which will correspond to medial and lateral whole breast tangent fields. This treatment will be accomplished at 1.8 Gy per fraction. A forward planning technique will also be evaluated to determine if this approach improves the plan. It is anticipated that the patient will then receive a 10 Gy boost to the seroma cavity which has been contoured. This will be accomplished at 2 Gy per fraction.   This initial treatment will consist of a 3-D conformal technique. The seroma has been contoured as the primary target structure. Additionally, dose volume histograms of both this target as well as the lungs and heart will also be evaluated. Such an approach is necessary to ensure that the target area is adequately covered while the nearby critical  normal structures are adequately spared.  Plan:  The final anticipated total dose therefore will correspond to 60.4 Gy.    _______________________________   Jodelle Gross, MD, PhD

## 2015-11-24 NOTE — Progress Notes (Signed)
  Radiation Oncology         [phone]) [phone] ________________________________  Name: Maria Allen MRN: [id number]  Date: 11/14/2015  DOB: 1960-05-17  Optical Surface Tracking Plan:  Since intensity modulated radiotherapy (IMRT) and 3D conformal radiation treatment methods are predicated on accurate and precise positioning for treatment, intrafraction motion monitoring is medically necessary to ensure accurate and safe treatment delivery.  The ability to quantify intrafraction motion without excessive ionizing radiation dose can only be performed with optical surface tracking. Accordingly, surface imaging offers the opportunity to obtain 3D measurements of patient position throughout IMRT and 3D treatments without excessive radiation exposure.  I am ordering optical surface tracking for this patient's upcoming course of radiotherapy. ________________________________  Kyung Rudd, MD 11/24/2015 1:17 PM    Reference:   Ursula Alert, J, et al. Surface imaging-based analysis of intrafraction motion for breast radiotherapy patients.Journal of [place], n. 6, nov. 2014. ISSN [id number].   Available at: <http://www.jacmp.org/index.php/jacmp/article/view/4957>.

## 2015-11-26 ENCOUNTER — Encounter: Payer: Self-pay | Admitting: Radiation Oncology

## 2015-11-26 ENCOUNTER — Ambulatory Visit
Admission: RE | Admit: 2015-11-26 | Discharge: 2015-11-26 | Disposition: A | Discharge status: Home / Self Care | Payer: BLUE CROSS/BLUE SHIELD | Source: Ambulatory Visit | Attending: Radiation Oncology | Admitting: Radiation Oncology

## 2015-11-26 DIAGNOSIS — Z510 Encounter for antineoplastic radiation therapy: Secondary | ICD-10-CM | POA: Diagnosis not present

## 2015-11-26 NOTE — Progress Notes (Signed)
Rec'd paperwork back from Dr. Marguarite Arbour RN to give back to Ms. Occhipinti. Paperwork is copied and scanned into the chart.

## 2015-11-27 ENCOUNTER — Ambulatory Visit
Admission: RE | Admit: 2015-11-27 | Discharge: 2015-11-27 | Disposition: A | Discharge status: Home / Self Care | Payer: BLUE CROSS/BLUE SHIELD | Source: Ambulatory Visit | Attending: Radiation Oncology | Admitting: Radiation Oncology

## 2015-11-27 DIAGNOSIS — Z510 Encounter for antineoplastic radiation therapy: Secondary | ICD-10-CM | POA: Diagnosis not present

## 2015-11-28 ENCOUNTER — Ambulatory Visit
Admission: RE | Admit: 2015-11-28 | Discharge: 2015-11-28 | Disposition: A | Discharge status: Home / Self Care | Payer: BLUE CROSS/BLUE SHIELD | Source: Ambulatory Visit | Attending: Radiation Oncology | Admitting: Radiation Oncology

## 2015-11-28 DIAGNOSIS — Z510 Encounter for antineoplastic radiation therapy: Secondary | ICD-10-CM | POA: Diagnosis not present

## 2015-11-29 ENCOUNTER — Ambulatory Visit
Admission: RE | Admit: 2015-11-29 | Discharge: 2015-11-29 | Disposition: A | Discharge status: Home / Self Care | Payer: BLUE CROSS/BLUE SHIELD | Source: Ambulatory Visit | Attending: Radiation Oncology | Admitting: Radiation Oncology

## 2015-11-29 ENCOUNTER — Telehealth: Payer: Self-pay | Admitting: *Deleted

## 2015-11-29 DIAGNOSIS — Z510 Encounter for antineoplastic radiation therapy: Secondary | ICD-10-CM | POA: Diagnosis not present

## 2015-11-29 NOTE — Telephone Encounter (Signed)
Left message for a return phone call to follow up after start of radiation.   

## 2015-11-29 NOTE — Progress Notes (Signed)
   Department of Radiation Oncology  Phone:  [phone] Fax:        [phone]  Weekly Treatment Note    Name: Maria Allen Date: 11/30/2015 MRN: [id number] DOB: 14-May-1960   Diagnosis:     ICD-9-CM ICD-10-CM   1. Breast cancer of upper-inner quadrant of left female breast (HCC) 174.2 C50.212 hyaluronate sodium (RADIAPLEXRX) gel     Current dose: 12.6 Gy  Current fraction: 7   MEDICATIONS: Current Outpatient Prescriptions  Medication Sig Dispense Refill  . aspirin 325 MG tablet Take 650 mg by mouth daily as needed for moderate pain or headache. Reported on 09/05/2015    . hyaluronate sodium (RADIAPLEXRX) GEL Apply 1 application topically once.    . non-metallic deodorant Jethro Poling) MISC Apply 1 application topically daily as needed.    . phentermine 37.5 MG capsule Take 37.5 mg by mouth every morning.     No current facility-administered medications for this encounter.      ALLERGIES: Gadolinium derivatives   LABORATORY DATA:  Lab Results  Component Value Date   WBC 5.8 10/04/2015   HGB 13.4 10/04/2015   HCT 42.6 10/04/2015   MCV 89.3 10/04/2015   PLT 347 10/04/2015   Lab Results  Component Value Date   NA 139 09/05/2015   K 4.2 09/05/2015   CL 106 08/02/2014   CO2 26 09/05/2015   Lab Results  Component Value Date   ALT 13 09/05/2015   AST 18 09/05/2015   ALKPHOS 58 09/05/2015   BILITOT 0.51 09/05/2015     NARRATIVE: Maria Allen was seen today for weekly treatment management. The chart was checked and the patient's films were reviewed.  Ms. Rattan has received 7 fractions to her left breast.  She denies any pain today with mild hyperpigmentation.  She states that her left arm feels appetitie.  She admits to experiencing mild fatigue. The patient reports that she is feeling 'okay.'  She reported a heaviness in her left arm last night.   6:58 PM   Wt Readings from Last 3 Encounters:  11/23/15 261 lb (118.4 kg)  11/12/15 261 lb 8 oz (118.6 kg)    10/16/15 261 lb 12.8 oz (118.8 kg)     PHYSICAL EXAMINATION: vitals were not taken for this visit.       ASSESSMENT: The patient is doing satisfactorily with treatment.  PLAN: We will continue with the patient's radiation treatment as planned.    ------------------------------------------------   Tyler Pita, MD [place] Director and Director of Stereotactic Radiosurgery Direct Dial: [phone]  Fax: [phone] [email or web address]  Skype  [email or web address]    This document serves as a record of services personally performed by Tyler Pita, MD. It was created on his behalf by Truddie Hidden, a trained medical scribe. The creation of this record is based on the scribe's personal observations and the provider's statements to them. This document has been checked and approved by the attending provider.

## 2015-11-30 ENCOUNTER — Ambulatory Visit
Admission: RE | Admit: 2015-11-30 | Discharge: 2015-11-30 | Disposition: A | Discharge status: Home / Self Care | Payer: BLUE CROSS/BLUE SHIELD | Source: Ambulatory Visit | Attending: Radiation Oncology | Admitting: Radiation Oncology

## 2015-11-30 DIAGNOSIS — Z510 Encounter for antineoplastic radiation therapy: Secondary | ICD-10-CM | POA: Diagnosis not present

## 2015-11-30 DIAGNOSIS — C50212 Malignant neoplasm of upper-inner quadrant of left female breast: Secondary | ICD-10-CM

## 2015-11-30 MED ORDER — RADIAPLEXRX EX GEL
Freq: Once | CUTANEOUS | Status: AC
Start: 2015-11-30 — End: 2015-11-30
  Administered 2015-11-30: 10:00:00 via TOPICAL

## 2015-11-30 NOTE — Progress Notes (Signed)
Maria Allen has received 7 fractions to her left breast.  She denies any pain today with mild hyperpigmentation.  She states that her left arm feels appetitie.  She admits to experiencing mild fatigue

## 2015-12-03 ENCOUNTER — Ambulatory Visit
Admission: RE | Admit: 2015-12-03 | Discharge: 2015-12-03 | Disposition: A | Discharge status: Home / Self Care | Payer: BLUE CROSS/BLUE SHIELD | Source: Ambulatory Visit | Attending: Radiation Oncology | Admitting: Radiation Oncology

## 2015-12-03 DIAGNOSIS — Z510 Encounter for antineoplastic radiation therapy: Secondary | ICD-10-CM | POA: Diagnosis not present

## 2015-12-04 ENCOUNTER — Ambulatory Visit
Admission: RE | Admit: 2015-12-04 | Discharge: 2015-12-04 | Disposition: A | Discharge status: Home / Self Care | Payer: BLUE CROSS/BLUE SHIELD | Source: Ambulatory Visit | Attending: Radiation Oncology | Admitting: Radiation Oncology

## 2015-12-04 DIAGNOSIS — Z510 Encounter for antineoplastic radiation therapy: Secondary | ICD-10-CM | POA: Diagnosis not present

## 2015-12-05 ENCOUNTER — Ambulatory Visit
Admission: RE | Admit: 2015-12-05 | Discharge: 2015-12-05 | Disposition: A | Discharge status: Home / Self Care | Payer: BLUE CROSS/BLUE SHIELD | Source: Ambulatory Visit | Attending: Radiation Oncology | Admitting: Radiation Oncology

## 2015-12-05 DIAGNOSIS — Z510 Encounter for antineoplastic radiation therapy: Secondary | ICD-10-CM | POA: Diagnosis not present

## 2015-12-06 ENCOUNTER — Ambulatory Visit
Admission: RE | Admit: 2015-12-06 | Discharge: 2015-12-06 | Disposition: A | Discharge status: Home / Self Care | Payer: BLUE CROSS/BLUE SHIELD | Source: Ambulatory Visit | Attending: Radiation Oncology | Admitting: Radiation Oncology

## 2015-12-06 DIAGNOSIS — Z510 Encounter for antineoplastic radiation therapy: Secondary | ICD-10-CM | POA: Diagnosis not present

## 2015-12-07 ENCOUNTER — Ambulatory Visit
Admission: RE | Admit: 2015-12-07 | Discharge: 2015-12-07 | Disposition: A | Discharge status: Home / Self Care | Payer: BLUE CROSS/BLUE SHIELD | Source: Ambulatory Visit | Attending: Radiation Oncology | Admitting: Radiation Oncology

## 2015-12-07 ENCOUNTER — Encounter: Payer: Self-pay | Admitting: Radiation Oncology

## 2015-12-07 VITALS — BP 123/64 | HR 70 | Temp 98.4°F | Resp 16 | Ht 60.0 in | Wt 262.0 lb

## 2015-12-07 DIAGNOSIS — C50212 Malignant neoplasm of upper-inner quadrant of left female breast: Secondary | ICD-10-CM

## 2015-12-07 DIAGNOSIS — Z510 Encounter for antineoplastic radiation therapy: Secondary | ICD-10-CM | POA: Diagnosis not present

## 2015-12-07 MED ORDER — RADIAPLEXRX EX GEL
Freq: Once | CUTANEOUS | Status: AC
  Administered 2015-12-07: 09:00:00 via TOPICAL

## 2015-12-07 NOTE — Progress Notes (Signed)
   Department of Radiation Oncology  Phone:  [phone] Fax:        [phone]  Weekly Treatment Note    Name: Maria Allen Date: 12/07/2015 MRN: [id number] DOB: December 10, 1960   Diagnosis:     ICD-9-CM ICD-10-CM   1. Breast cancer of upper-inner quadrant of left female breast (HCC) 174.2 C50.212      Current dose: 21.6 Gy  Current fraction: 12   MEDICATIONS: Current Outpatient Prescriptions  Medication Sig Dispense Refill  . hyaluronate sodium (RADIAPLEXRX) GEL Apply 1 application topically once.    . non-metallic deodorant Jethro Poling) MISC Apply 1 application topically daily as needed.    . phentermine 37.5 MG capsule Take 37.5 mg by mouth every morning.    Marland Kitchen aspirin 325 MG tablet Take 650 mg by mouth daily as needed for moderate pain or headache. Reported on 09/05/2015     No current facility-administered medications for this encounter.      ALLERGIES: Gadolinium derivatives   LABORATORY DATA:  Lab Results  Component Value Date   WBC 5.8 10/04/2015   HGB 13.4 10/04/2015   HCT 42.6 10/04/2015   MCV 89.3 10/04/2015   PLT 347 10/04/2015   Lab Results  Component Value Date   NA 139 09/05/2015   K 4.2 09/05/2015   CL 106 08/02/2014   CO2 26 09/05/2015   Lab Results  Component Value Date   ALT 13 09/05/2015   AST 18 09/05/2015   ALKPHOS 58 09/05/2015   BILITOT 0.51 09/05/2015     NARRATIVE: Maria Allen was seen today for weekly treatment management. The chart was checked and the patient's films were reviewed.  Weekly rad txs left breast 12/33 completed, skin looks great, intact, slight tanning under axilla,  radiaplex bid, gave another tube appetite good no pain 9:10 AM BP 123/64 (BP Location: Right Arm, Patient Position: Sitting, Cuff Size: Large)   Pulse 70   Temp 98.4 F (36.9 C) (Oral)   Resp 16   Ht 5' (1.524 m)   Wt 262 lb (118.8 kg)   BMI 51.17 kg/m   Wt Readings from Last 3 Encounters:  12/07/15 262 lb (118.8 kg)  11/23/15 261 lb (118.4 kg)   11/12/15 261 lb 8 oz (118.6 kg)    PHYSICAL EXAMINATION: height is 5' (1.524 m) and weight is 262 lb (118.8 kg). Her oral temperature is 98.4 F (36.9 C). Her blood pressure is 123/64 and her pulse is 70. Her respiration is 16.      Hyperpigmentation emerging in the treatment area. No significant desquamation.  ASSESSMENT: The patient is doing satisfactorily with treatment.  PLAN: We will continue with the patient's radiation treatment as planned.

## 2015-12-07 NOTE — Addendum Note (Signed)
Encounter addended by: Doreen Beam, RN on: 12/07/2015  9:16 AM<BR>    Actions taken: [facility] administration accepted

## 2015-12-07 NOTE — Progress Notes (Signed)
Weekly rad txs left breast 12/33 completed, skin looks great, intact, slight tanning under axilla,  radiaplex bid, gave another tube appetite good no pain 8:29 AM BP 123/64 (BP Location: Right Arm, Patient Position: Sitting, Cuff Size: Large)   Pulse 70   Temp 98.4 F (36.9 C) (Oral)   Resp 16   Ht 5' (1.524 m)   Wt 262 lb (118.8 kg)   BMI 51.17 kg/m   Wt Readings from Last 3 Encounters:  12/07/15 262 lb (118.8 kg)  11/23/15 261 lb (118.4 kg)  11/12/15 261 lb 8 oz (118.6 kg)

## 2015-12-07 NOTE — Addendum Note (Signed)
Encounter addended by: Doreen Beam, RN on: 12/07/2015  9:12 AM<BR>    Actions taken: Order Entry activity accessed, Diagnosis association updated

## 2015-12-10 ENCOUNTER — Ambulatory Visit
Admission: RE | Admit: 2015-12-10 | Discharge: 2015-12-10 | Disposition: A | Discharge status: Home / Self Care | Payer: BLUE CROSS/BLUE SHIELD | Source: Ambulatory Visit | Attending: Radiation Oncology | Admitting: Radiation Oncology

## 2015-12-10 ENCOUNTER — Encounter: Payer: Self-pay | Admitting: Hematology and Oncology

## 2015-12-10 ENCOUNTER — Encounter: Payer: Self-pay | Admitting: Radiation Oncology

## 2015-12-10 DIAGNOSIS — Z510 Encounter for antineoplastic radiation therapy: Secondary | ICD-10-CM | POA: Diagnosis not present

## 2015-12-10 NOTE — Progress Notes (Signed)
patient left forms with me. I left for dr. Lindi Adie to sign

## 2015-12-11 ENCOUNTER — Ambulatory Visit
Admission: RE | Admit: 2015-12-11 | Discharge: 2015-12-11 | Disposition: A | Discharge status: Home / Self Care | Payer: BLUE CROSS/BLUE SHIELD | Source: Ambulatory Visit | Attending: Radiation Oncology | Admitting: Radiation Oncology

## 2015-12-11 DIAGNOSIS — Z510 Encounter for antineoplastic radiation therapy: Secondary | ICD-10-CM | POA: Diagnosis not present

## 2015-12-12 ENCOUNTER — Encounter: Payer: Self-pay | Admitting: Hematology and Oncology

## 2015-12-12 ENCOUNTER — Ambulatory Visit
Admission: RE | Admit: 2015-12-12 | Discharge: 2015-12-12 | Disposition: A | Discharge status: Home / Self Care | Payer: BLUE CROSS/BLUE SHIELD | Source: Ambulatory Visit | Attending: Radiation Oncology | Admitting: Radiation Oncology

## 2015-12-12 DIAGNOSIS — Z510 Encounter for antineoplastic radiation therapy: Secondary | ICD-10-CM | POA: Diagnosis not present

## 2015-12-12 NOTE — Progress Notes (Signed)
patient left forms with me. I left for dr. Lindi Adie to sign- mailed per request and sent to medical [facility]

## 2015-12-13 ENCOUNTER — Ambulatory Visit
Admission: RE | Admit: 2015-12-13 | Discharge: 2015-12-13 | Disposition: A | Discharge status: Home / Self Care | Payer: BLUE CROSS/BLUE SHIELD | Source: Ambulatory Visit | Attending: Radiation Oncology | Admitting: Radiation Oncology

## 2015-12-13 DIAGNOSIS — Z510 Encounter for antineoplastic radiation therapy: Secondary | ICD-10-CM | POA: Diagnosis not present

## 2015-12-14 ENCOUNTER — Encounter: Payer: Self-pay | Admitting: Radiation Oncology

## 2015-12-14 ENCOUNTER — Ambulatory Visit
Admission: RE | Admit: 2015-12-14 | Discharge: 2015-12-14 | Disposition: A | Discharge status: Home / Self Care | Payer: BLUE CROSS/BLUE SHIELD | Source: Ambulatory Visit | Attending: Radiation Oncology | Admitting: Radiation Oncology

## 2015-12-14 VITALS — BP 134/64 | HR 97 | Temp 98.1°F | Wt 260.6 lb

## 2015-12-14 DIAGNOSIS — Z510 Encounter for antineoplastic radiation therapy: Secondary | ICD-10-CM | POA: Diagnosis not present

## 2015-12-14 DIAGNOSIS — C50212 Malignant neoplasm of upper-inner quadrant of left female breast: Secondary | ICD-10-CM

## 2015-12-14 NOTE — Progress Notes (Signed)
   Department of Radiation Oncology  Phone:  [phone] Fax:        [phone]  Weekly Treatment Note    Name: Maria Allen Date: 12/16/2015 MRN: [id number] DOB: 16-Feb-1961   Diagnosis:     ICD-9-CM ICD-10-CM   1. Breast cancer of upper-inner quadrant of left female breast (HCC) 174.2 C50.212      Current dose: 30.6 Gy  Current fraction: 17   MEDICATIONS: Current Outpatient Prescriptions  Medication Sig Dispense Refill  . aspirin 325 MG tablet Take 650 mg by mouth daily as needed for moderate pain or headache. Reported on 09/05/2015    . hyaluronate sodium (RADIAPLEXRX) GEL Apply 1 application topically once.    . non-metallic deodorant Jethro Poling) MISC Apply 1 application topically daily as needed.    . phentermine 37.5 MG capsule Take 37.5 mg by mouth every morning.     No current facility-administered medications for this encounter.      ALLERGIES: Gadolinium derivatives   LABORATORY DATA:  Lab Results  Component Value Date   WBC 5.8 10/04/2015   HGB 13.4 10/04/2015   HCT 42.6 10/04/2015   MCV 89.3 10/04/2015   PLT 347 10/04/2015   Lab Results  Component Value Date   NA 139 09/05/2015   K 4.2 09/05/2015   CL 106 08/02/2014   CO2 26 09/05/2015   Lab Results  Component Value Date   ALT 13 09/05/2015   AST 18 09/05/2015   ALKPHOS 58 09/05/2015   BILITOT 0.51 09/05/2015     NARRATIVE: Maria Allen was seen today for weekly treatment management. The chart was checked and the patient's films were reviewed.  Maria Allen returns for weekly radiation treatment to the left breast. She is doing well. She uses Radiaplex bid. Reports occasional tingling sensations in the left breast, resolves quickly. She reports no other issues. She will be out of town on Monday and Tuesday.   9:54 AM BP 134/64 (BP Location: Right Arm, Patient Position: Sitting, Cuff Size: Large)   Pulse 97   Temp 98.1 F (36.7 C) (Oral)   Wt 260 lb 9.6 oz (118.2 kg)   BMI 50.89 kg/m   Wt  Readings from Last 3 Encounters:  12/14/15 260 lb 9.6 oz (118.2 kg)  12/07/15 262 lb (118.8 kg)  11/23/15 261 lb (118.4 kg)    PHYSICAL EXAMINATION: weight is 260 lb 9.6 oz (118.2 kg). Her oral temperature is 98.1 F (36.7 C). Her blood pressure is 134/64 and her pulse is 97.      Slight hyperpigmentation  ASSESSMENT: The patient is doing satisfactorily with treatment.  PLAN: We will continue with the patient's radiation treatment as planned. I spoke with the patient about doubling up treatment one day next week to make up for missing Monday and Tuesday of next week. The patient will speak with scheduling to set this up.     This document serves as a record of services personally performed by Kyung Rudd, MD. It was created on his behalf by Arlyce Harman, a trained medical scribe. The creation of this record is based on the scribe's personal observations and the provider's statements to them. This document has been checked and approved by the attending provider.  ------------------------------------------------  Jodelle Gross, MD, PhD

## 2015-12-14 NOTE — Progress Notes (Signed)
Weekly rad txs left breast slight tanning, skin intact, uses radiaplex bid,. Occasional sharp pains in left breast,resolves quickly no other issues. Will be out of town Monday and Tuesday 8:24 AM BP 134/64 (BP Location: Right Arm, Patient Position: Sitting, Cuff Size: Large)   Pulse 97   Temp 98.1 F (36.7 C) (Oral)   Wt 260 lb 9.6 oz (118.2 kg)   BMI 50.89 kg/m   Wt Readings from Last 3 Encounters:  12/14/15 260 lb 9.6 oz (118.2 kg)  12/07/15 262 lb (118.8 kg)  11/23/15 261 lb (118.4 kg)

## 2015-12-17 ENCOUNTER — Ambulatory Visit: Payer: BLUE CROSS/BLUE SHIELD

## 2015-12-18 ENCOUNTER — Ambulatory Visit: Payer: BLUE CROSS/BLUE SHIELD

## 2015-12-19 ENCOUNTER — Ambulatory Visit
Admission: RE | Admit: 2015-12-19 | Discharge: 2015-12-19 | Disposition: A | Discharge status: Home / Self Care | Payer: BLUE CROSS/BLUE SHIELD | Source: Ambulatory Visit | Attending: Radiation Oncology | Admitting: Radiation Oncology

## 2015-12-19 DIAGNOSIS — Z510 Encounter for antineoplastic radiation therapy: Secondary | ICD-10-CM | POA: Diagnosis not present

## 2015-12-20 ENCOUNTER — Ambulatory Visit
Admission: RE | Admit: 2015-12-20 | Discharge: 2015-12-20 | Disposition: A | Discharge status: Home / Self Care | Payer: BLUE CROSS/BLUE SHIELD | Source: Ambulatory Visit | Attending: Radiation Oncology | Admitting: Radiation Oncology

## 2015-12-20 DIAGNOSIS — Z510 Encounter for antineoplastic radiation therapy: Secondary | ICD-10-CM | POA: Diagnosis not present

## 2015-12-21 ENCOUNTER — Encounter: Payer: Self-pay | Admitting: Radiation Oncology

## 2015-12-21 ENCOUNTER — Encounter: Payer: Self-pay | Admitting: *Deleted

## 2015-12-21 ENCOUNTER — Telehealth: Payer: Self-pay | Admitting: *Deleted

## 2015-12-21 ENCOUNTER — Ambulatory Visit
Admission: RE | Admit: 2015-12-21 | Discharge: 2015-12-21 | Disposition: A | Discharge status: Home / Self Care | Payer: BLUE CROSS/BLUE SHIELD | Source: Ambulatory Visit | Attending: Radiation Oncology | Admitting: Radiation Oncology

## 2015-12-21 VITALS — BP 133/79 | HR 89 | Temp 98.5°F | Resp 20 | Wt 258.2 lb

## 2015-12-21 DIAGNOSIS — Z510 Encounter for antineoplastic radiation therapy: Secondary | ICD-10-CM | POA: Diagnosis not present

## 2015-12-21 DIAGNOSIS — C50212 Malignant neoplasm of upper-inner quadrant of left female breast: Secondary | ICD-10-CM

## 2015-12-21 MED ORDER — RADIAPLEXRX EX GEL
Freq: Once | CUTANEOUS | Status: AC
  Administered 2015-12-21: 09:00:00 via TOPICAL

## 2015-12-21 NOTE — Telephone Encounter (Signed)
error 

## 2015-12-21 NOTE — Progress Notes (Addendum)
Weekly rad txs  Left breast,21/33 completed, mild tanning, sensitive around her nipple and under axila, gave another tube radiaplex, appetite good mild fatigue,  8:27 AM BP 133/79 (BP Location: Right Arm, Patient Position: Sitting, Cuff Size: Large)   Pulse 89   Temp 98.5 F (36.9 C) (Oral)   Resp 20   Wt 258 lb 3.2 oz (117.1 kg)   BMI 50.43 kg/m   Wt Readings from Last 3 Encounters:  12/21/15 258 lb 3.2 oz (117.1 kg)  12/14/15 260 lb 9.6 oz (118.2 kg)  12/07/15 262 lb (118.8 kg)

## 2015-12-21 NOTE — Progress Notes (Signed)
[place] Work  Clinical Social Work was referred by patient for assessment of psychosocial needs due to financial concerns.  Clinical Social Worker contacted patient at home to offer support and assess for needs. CSW left message re packet the pt dropped off. CSW awaits return call and will attempt to check in with pt next week at her treatment.       Loren Racer, [place] Worker [place]  [place] Phone: [phone] Fax: [phone]

## 2015-12-21 NOTE — Progress Notes (Signed)
   Department of Radiation Oncology  Phone:  [phone] Fax:        [phone]  Weekly Treatment Note    Name: Maria Allen Date: 12/21/2015 MRN: [id number] DOB: January 25, 1961   Diagnosis:     ICD-9-CM ICD-10-CM   1. Breast cancer of upper-inner quadrant of left female breast (HCC) 174.2 C50.212 hyaluronate sodium (RADIAPLEXRX) gel     Current dose: 37.8 Gy  Current fraction: 21   MEDICATIONS: Current Outpatient Prescriptions  Medication Sig Dispense Refill  . aspirin 325 MG tablet Take 650 mg by mouth daily as needed for moderate pain or headache. Reported on 09/05/2015    . hyaluronate sodium (RADIAPLEXRX) GEL Apply 1 application topically once.    . non-metallic deodorant Jethro Poling) MISC Apply 1 application topically daily as needed.    . phentermine 37.5 MG capsule Take 37.5 mg by mouth every morning.     No current facility-administered medications for this encounter.      ALLERGIES: Gadolinium derivatives   LABORATORY DATA:  Lab Results  Component Value Date   WBC 5.8 10/04/2015   HGB 13.4 10/04/2015   HCT 42.6 10/04/2015   MCV 89.3 10/04/2015   PLT 347 10/04/2015   Lab Results  Component Value Date   NA 139 09/05/2015   K 4.2 09/05/2015   CL 106 08/02/2014   CO2 26 09/05/2015   Lab Results  Component Value Date   ALT 13 09/05/2015   AST 18 09/05/2015   ALKPHOS 58 09/05/2015   BILITOT 0.51 09/05/2015     NARRATIVE: Maria Allen was seen today for weekly treatment management. The chart was checked and the patient's films were reviewed.  Weekly rad txs  Left breast,21/33 completed, mild tanning, sensitive around her nipple and under axila, gave another tube radiaplex, appetite good mild fatigue,  8:41 AM BP 133/79 (BP Location: Right Arm, Patient Position: Sitting, Cuff Size: Large)   Pulse 89   Temp 98.5 F (36.9 C) (Oral)   Resp 20   Wt 258 lb 3.2 oz (117.1 kg)   BMI 50.43 kg/m   Wt Readings from Last 3 Encounters:  12/21/15 258 lb 3.2 oz  (117.1 kg)  12/14/15 260 lb 9.6 oz (118.2 kg)  12/07/15 262 lb (118.8 kg)    PHYSICAL EXAMINATION: weight is 258 lb 3.2 oz (117.1 kg). Her oral temperature is 98.5 F (36.9 C). Her blood pressure is 133/79 and her pulse is 89. Her respiration is 20.      Hyperpigmentation present in the treatment area. Overall her skin looks good.  ASSESSMENT: The patient is doing satisfactorily with treatment.  PLAN: We will continue with the patient's radiation treatment as planned.

## 2015-12-24 ENCOUNTER — Ambulatory Visit
Admission: RE | Admit: 2015-12-24 | Discharge: 2015-12-24 | Disposition: A | Discharge status: Home / Self Care | Payer: BLUE CROSS/BLUE SHIELD | Source: Ambulatory Visit | Attending: Radiation Oncology | Admitting: Radiation Oncology

## 2015-12-24 DIAGNOSIS — Z510 Encounter for antineoplastic radiation therapy: Secondary | ICD-10-CM | POA: Diagnosis not present

## 2015-12-25 ENCOUNTER — Ambulatory Visit
Admission: RE | Admit: 2015-12-25 | Discharge: 2015-12-25 | Disposition: A | Discharge status: Home / Self Care | Payer: BLUE CROSS/BLUE SHIELD | Source: Ambulatory Visit | Attending: Radiation Oncology | Admitting: Radiation Oncology

## 2015-12-25 DIAGNOSIS — Z510 Encounter for antineoplastic radiation therapy: Secondary | ICD-10-CM | POA: Diagnosis not present

## 2015-12-26 ENCOUNTER — Ambulatory Visit
Admission: RE | Admit: 2015-12-26 | Discharge: 2015-12-26 | Disposition: A | Discharge status: Home / Self Care | Payer: BLUE CROSS/BLUE SHIELD | Source: Ambulatory Visit | Attending: Radiation Oncology | Admitting: Radiation Oncology

## 2015-12-26 DIAGNOSIS — Z510 Encounter for antineoplastic radiation therapy: Secondary | ICD-10-CM | POA: Diagnosis not present

## 2015-12-27 ENCOUNTER — Ambulatory Visit
Admission: RE | Admit: 2015-12-27 | Discharge: 2015-12-27 | Disposition: A | Discharge status: Home / Self Care | Payer: BLUE CROSS/BLUE SHIELD | Source: Ambulatory Visit | Attending: Radiation Oncology | Admitting: Radiation Oncology

## 2015-12-27 DIAGNOSIS — Z510 Encounter for antineoplastic radiation therapy: Secondary | ICD-10-CM | POA: Diagnosis not present

## 2015-12-28 ENCOUNTER — Ambulatory Visit
Admission: RE | Admit: 2015-12-28 | Discharge: 2015-12-28 | Disposition: A | Discharge status: Home / Self Care | Payer: BLUE CROSS/BLUE SHIELD | Source: Ambulatory Visit | Attending: Radiation Oncology | Admitting: Radiation Oncology

## 2015-12-28 DIAGNOSIS — Z510 Encounter for antineoplastic radiation therapy: Secondary | ICD-10-CM | POA: Diagnosis not present

## 2015-12-28 DIAGNOSIS — C50212 Malignant neoplasm of upper-inner quadrant of left female breast: Secondary | ICD-10-CM

## 2015-12-28 NOTE — Progress Notes (Signed)
   Department of Radiation Oncology  Phone:  [phone] Fax:        [phone]  Weekly Treatment Note    Name: Maria Allen Date: 12/30/2015 MRN: [id number] DOB: 1961/03/26   Diagnosis:     ICD-9-CM ICD-10-CM   1. Breast cancer of upper-inner quadrant of left female breast (HCC) 174.2 C50.212      Current dose: 46.8 Gy  Current fraction: 26   MEDICATIONS: Current Outpatient Prescriptions  Medication Sig Dispense Refill  . aspirin 325 MG tablet Take 650 mg by mouth daily as needed for moderate pain or headache. Reported on 09/05/2015    . hyaluronate sodium (RADIAPLEXRX) GEL Apply 1 application topically once.    . non-metallic deodorant Jethro Poling) MISC Apply 1 application topically daily as needed.    . phentermine 37.5 MG capsule Take 37.5 mg by mouth every morning.     No current facility-administered medications for this encounter.      ALLERGIES: Gadolinium derivatives   LABORATORY DATA:  Lab Results  Component Value Date   WBC 5.8 10/04/2015   HGB 13.4 10/04/2015   HCT 42.6 10/04/2015   MCV 89.3 10/04/2015   PLT 347 10/04/2015   Lab Results  Component Value Date   NA 139 09/05/2015   K 4.2 09/05/2015   CL 106 08/02/2014   CO2 26 09/05/2015   Lab Results  Component Value Date   ALT 13 09/05/2015   AST 18 09/05/2015   ALKPHOS 58 09/05/2015   BILITOT 0.51 09/05/2015     NARRATIVE: Maria Allen was seen today for weekly treatment management. The chart was checked and the patient's films were reviewed.  She complains of discomfort in the left axilla, but is otherwise well. She continues to use skin cream.  7:45 PM There were no vitals taken for this visit.  Wt Readings from Last 3 Encounters:  12/21/15 258 lb 3.2 oz (117.1 kg)  12/14/15 260 lb 9.6 oz (118.2 kg)  12/07/15 262 lb (118.8 kg)    PHYSICAL EXAMINATION: vitals were not taken for this visit. Increased dermatitis in the left axillary region. Otherwise, the skin looked well with no  desquamation.  ASSESSMENT: The patient is doing satisfactorily with treatment.  PLAN: We will continue with the patient's radiation treatment as planned.   This document serves as a record of services personally performed by Kyung Rudd, MD. It was created on his behalf by Darcus Austin, a trained medical scribe. The creation of this record is based on the scribe's personal observations and the provider's statements to them. This document has been checked and approved by the attending provider.

## 2015-12-28 NOTE — Progress Notes (Signed)
MD saw patient in the back Lianc table, not sent to nursing for assessment

## 2015-12-31 ENCOUNTER — Telehealth: Payer: Self-pay | Admitting: *Deleted

## 2015-12-31 ENCOUNTER — Ambulatory Visit: Payer: BLUE CROSS/BLUE SHIELD

## 2015-12-31 NOTE — Telephone Encounter (Signed)
FYI Call from patient asking to "reschedule RT appointments.  I'm stuck in [place] with Jodi Mourning, there are no flights."  Call transfered to "[facility]" ext: [phone].

## 2016-01-01 ENCOUNTER — Ambulatory Visit: Payer: BLUE CROSS/BLUE SHIELD

## 2016-01-02 ENCOUNTER — Ambulatory Visit
Admission: RE | Admit: 2016-01-02 | Discharge: 2016-01-02 | Disposition: A | Discharge status: Home / Self Care | Payer: BLUE CROSS/BLUE SHIELD | Source: Ambulatory Visit | Attending: Radiation Oncology | Admitting: Radiation Oncology

## 2016-01-02 ENCOUNTER — Telehealth: Payer: Self-pay | Admitting: *Deleted

## 2016-01-02 DIAGNOSIS — Z510 Encounter for antineoplastic radiation therapy: Secondary | ICD-10-CM | POA: Diagnosis not present

## 2016-01-02 NOTE — Telephone Encounter (Signed)
Called a prescription for Tamiflu 75mg  x 7 days to [employer] [facility] at [facility] at [phone] for Ms. Maria Allen, due to exposure to the flu virus in radiation therapy.  Maria Allen expressed thanks.

## 2016-01-03 ENCOUNTER — Ambulatory Visit: Payer: BLUE CROSS/BLUE SHIELD

## 2016-01-03 ENCOUNTER — Ambulatory Visit
Admission: RE | Admit: 2016-01-03 | Discharge: 2016-01-03 | Disposition: A | Discharge status: Home / Self Care | Payer: BLUE CROSS/BLUE SHIELD | Source: Ambulatory Visit | Attending: Radiation Oncology | Admitting: Radiation Oncology

## 2016-01-03 DIAGNOSIS — Z510 Encounter for antineoplastic radiation therapy: Secondary | ICD-10-CM | POA: Diagnosis not present

## 2016-01-04 ENCOUNTER — Encounter: Payer: Self-pay | Admitting: Radiation Oncology

## 2016-01-04 ENCOUNTER — Ambulatory Visit: Payer: BLUE CROSS/BLUE SHIELD

## 2016-01-04 ENCOUNTER — Ambulatory Visit
Admission: RE | Admit: 2016-01-04 | Discharge: 2016-01-04 | Disposition: A | Discharge status: Home / Self Care | Payer: BLUE CROSS/BLUE SHIELD | Source: Ambulatory Visit | Attending: Radiation Oncology | Admitting: Radiation Oncology

## 2016-01-04 VITALS — BP 133/91 | HR 88 | Temp 97.6°F | Resp 18 | Wt 256.4 lb

## 2016-01-04 DIAGNOSIS — Z510 Encounter for antineoplastic radiation therapy: Secondary | ICD-10-CM | POA: Diagnosis not present

## 2016-01-04 DIAGNOSIS — C50212 Malignant neoplasm of upper-inner quadrant of left female breast: Secondary | ICD-10-CM

## 2016-01-04 NOTE — Progress Notes (Signed)
   Department of Radiation Oncology  Phone:  [phone] Fax:        [phone]  Weekly Treatment Note    Name: Maria Allen Date: 01/04/2016 MRN: [id number] DOB: [birth year]   Diagnosis:     ICD-9-CM ICD-10-CM   1. Breast cancer of upper-inner quadrant of left female breast (HCC) 174.2 C50.212      Current dose: 54.4 Gy  Current fraction: 30   MEDICATIONS: Current Outpatient Prescriptions  Medication Sig Dispense Refill  . aspirin 325 MG tablet Take 650 mg by mouth daily as needed for moderate pain or headache. Reported on 09/05/2015    . hyaluronate sodium (RADIAPLEXRX) GEL Apply 1 application topically once.    . non-metallic deodorant Jethro Poling) MISC Apply 1 application topically daily as needed.    . phentermine 37.5 MG capsule Take 37.5 mg by mouth every morning.    Marland Kitchen oseltamivir (TAMIFLU) 75 MG capsule Take 75 mg by mouth.  0   No current facility-administered medications for this encounter.      ALLERGIES: Gadolinium derivatives   LABORATORY DATA:  Lab Results  Component Value Date   WBC 5.8 10/04/2015   HGB 13.4 10/04/2015   HCT 42.6 10/04/2015   MCV 89.3 10/04/2015   PLT 347 10/04/2015   Lab Results  Component Value Date   NA 139 09/05/2015   K 4.2 09/05/2015   CL 106 08/02/2014   CO2 26 09/05/2015   Lab Results  Component Value Date   ALT 13 09/05/2015   AST 18 09/05/2015   ALKPHOS 58 09/05/2015   BILITOT 0.51 09/05/2015     NARRATIVE: Maria Allen was seen today for weekly treatment management. The chart was checked and the patient's films were reviewed.  Weekly rad txs left breast 30/33 completed,   Mild peeling under left axilla, hyperpigmentation, using radiaplex bid, appetite good,  8:54 AM .BP (!) 133/91 (BP Location: Right Arm, Patient Position: Sitting, Cuff Size: Normal)   Pulse 88   Temp 97.6 F (36.4 C) (Oral)   Resp 18   Wt 256 lb 6.4 oz (116.3 kg)   BMI 50.07 kg/m   Wt Readings from Last 3 Encounters:  01/04/16 256 lb  6.4 oz (116.3 kg)  12/21/15 258 lb 3.2 oz (117.1 kg)  12/14/15 260 lb 9.6 oz (118.2 kg)    PHYSICAL EXAMINATION: weight is 256 lb 6.4 oz (116.3 kg). Her oral temperature is 97.6 F (36.4 C). Her blood pressure is 133/91 (abnormal) and her pulse is 88. Her respiration is 18.      Hyperpigmentation present in the treatment area. No significant desquamation.  ASSESSMENT: The patient is doing satisfactorily with treatment.  PLAN: We will continue with the patient's radiation treatment as planned. I am very pleased with the patient was able to return from [place] without a major delay in her care. We will continue her radiation treatment next week and finish.

## 2016-01-04 NOTE — Progress Notes (Signed)
Weekly rad txs left breast 30/33 completed,   Mild peeling under left axilla, hyperpigmentation, using radiaplex bid, appetite good,  8:37 AM .BP (!) 133/91 (BP Location: Right Arm, Patient Position: Sitting, Cuff Size: Normal)   Pulse 88   Temp 97.6 F (36.4 C) (Oral)   Resp 18   Wt 256 lb 6.4 oz (116.3 kg)   BMI 50.07 kg/m   Wt Readings from Last 3 Encounters:  01/04/16 256 lb 6.4 oz (116.3 kg)  12/21/15 258 lb 3.2 oz (117.1 kg)  12/14/15 260 lb 9.6 oz (118.2 kg)

## 2016-01-04 NOTE — Progress Notes (Signed)
  Radiation Oncology         [phone] ________________________________  Name: Maria Allen MRN: [id number]  Date: 12/10/2015  DOB: [birth year]  Complex simulation note  The patient has undergone complex simulation for her upcoming boost treatment for her diagnosis of breast cancer on the left. The patient has initially been planned to receive 50.4 Gy. The patient will now receive a 10 Gy boost to the seroma cavity which has been contoured. This will be accomplished using an en face electron field. Based on the depth of the target area, 18 MeV electrons will be used. The patient's final total dose therefore will be 60.4 Gy. A complex isodose plan from the electron [facility] Carlo calculation is requested for the boost treatment.   _______________________________  Jodelle Gross, MD, PhD

## 2016-01-08 ENCOUNTER — Ambulatory Visit: Payer: BLUE CROSS/BLUE SHIELD

## 2016-01-08 DIAGNOSIS — Z510 Encounter for antineoplastic radiation therapy: Secondary | ICD-10-CM | POA: Diagnosis not present

## 2016-01-09 ENCOUNTER — Ambulatory Visit
Admission: RE | Admit: 2016-01-09 | Discharge: 2016-01-09 | Disposition: A | Discharge status: Home / Self Care | Payer: BLUE CROSS/BLUE SHIELD | Source: Ambulatory Visit | Attending: Radiation Oncology | Admitting: Radiation Oncology

## 2016-01-09 ENCOUNTER — Encounter: Payer: Self-pay | Admitting: Radiation Oncology

## 2016-01-09 VITALS — BP 135/83 | HR 89 | Temp 99.3°F | Resp 20 | Wt 257.9 lb

## 2016-01-09 DIAGNOSIS — Z510 Encounter for antineoplastic radiation therapy: Secondary | ICD-10-CM | POA: Diagnosis not present

## 2016-01-09 DIAGNOSIS — C50212 Malignant neoplasm of upper-inner quadrant of left female breast: Secondary | ICD-10-CM

## 2016-01-09 MED ORDER — SONAFINE EX EMUL
1.0000 | Freq: Two times a day (BID) | CUTANEOUS | Status: DC
Start: 2016-01-09 — End: 2016-01-10
  Administered 2016-01-09: 1 via TOPICAL

## 2016-01-09 NOTE — Progress Notes (Signed)
   Department of Radiation Oncology  Phone:  [phone] Fax:        [phone]  Weekly Treatment Note    Name: Maria Allen Date: 01/09/2016 MRN: [id number] DOB: [birth year]   Diagnosis:     ICD-9-CM ICD-10-CM   1. Breast cancer of upper-inner quadrant of left female breast (HCC) 174.2 C50.212      Current dose: 58.4 Gy  Current fraction: 32   MEDICATIONS: Current Outpatient Prescriptions  Medication Sig Dispense Refill  . aspirin 325 MG tablet Take 650 mg by mouth daily as needed for moderate pain or headache. Reported on 09/05/2015    . hyaluronate sodium (RADIAPLEXRX) GEL Apply 1 application topically once.    . non-metallic deodorant Jethro Poling) MISC Apply 1 application topically daily as needed.    . phentermine 37.5 MG capsule Take 37.5 mg by mouth every morning.    Marland Kitchen oseltamivir (TAMIFLU) 75 MG capsule Take 75 mg by mouth.  0   No current facility-administered medications for this encounter.      ALLERGIES: Gadolinium derivatives   LABORATORY DATA:  Lab Results  Component Value Date   WBC 5.8 10/04/2015   HGB 13.4 10/04/2015   HCT 42.6 10/04/2015   MCV 89.3 10/04/2015   PLT 347 10/04/2015   Lab Results  Component Value Date   NA 139 09/05/2015   K 4.2 09/05/2015   CL 106 08/02/2014   CO2 26 09/05/2015   Lab Results  Component Value Date   ALT 13 09/05/2015   AST 18 09/05/2015   ALKPHOS 58 09/05/2015   BILITOT 0.51 09/05/2015     NARRATIVE: DEVAEH ZORGER was seen today for weekly treatment management. The chart was checked and the patient's films were reviewed.  Weekly rad txs left breast 32/33 completed,  Hyperpigmentation, under inframmay fold, skin abrasion, using neosporin there and under axilla, dry desquamation, radaiplex bid,  Gave 1 month follow up appt with Shona Simpson, PA, no c/o, mild fatigue,  8:51 AM BP (!) 133/91 (BP Location: Right Arm, Patient Position: Sitting, Cuff Size: Normal)   Pulse 88   Temp 97.6 F (36.4 C) (Oral)    Resp 18   Wt 256 lb 6.4 oz (116.3 kg)   BMI 50.07 kg/m   Wt Readings from Last 3 Encounters:  01/09/16 257 lb 14.4 oz (117 kg)  01/04/16 256 lb 6.4 oz (116.3 kg)  12/21/15 258 lb 3.2 oz (117.1 kg)    PHYSICAL EXAMINATION: weight is 256 lb 6.4 oz (116.3 kg). Her oral temperature is 97.6 F (36.4 C). Her blood pressure is 133/91 (abnormal) and her pulse is 88. Her respiration is 18.      The patient's skin has continued to do well without any moist desquamation  ASSESSMENT: The patient is doing satisfactorily with treatment.  PLAN: We will continue with the patient's radiation treatment as planned. The patient will finish her treatment and will follow-up in our clinic in 1 month for routine follow-up

## 2016-01-09 NOTE — Progress Notes (Signed)
Weekly rad txs left breast 32/33 completed,  Hyperpigmentation, under inframmay fold, skin abrasion, using neosporin there and under axilla, dry desquamation, radaiplex bid,  Gave 1 month follow up appt with Shona Simpson, PA, no c/o, mild fatigue,  8:27 AM BP 135/83 (BP Location: Right Arm, Patient Position: Sitting, Cuff Size: Normal)   Pulse 89   Temp 99.3 F (37.4 C) (Oral)   Resp 20   Wt 257 lb 14.4 oz (117 kg)   BMI 50.37 kg/m   Wt Readings from Last 3 Encounters:  01/09/16 257 lb 14.4 oz (117 kg)  01/04/16 256 lb 6.4 oz (116.3 kg)  12/21/15 258 lb 3.2 oz (117.1 kg)

## 2016-01-09 NOTE — Addendum Note (Signed)
Encounter addended by: Kyung Rudd, MD on: 01/09/2016  8:51 AM<BR>    Actions taken: Sign clinical note

## 2016-01-10 ENCOUNTER — Ambulatory Visit
Admission: RE | Admit: 2016-01-10 | Discharge: 2016-01-10 | Disposition: A | Discharge status: Home / Self Care | Payer: BLUE CROSS/BLUE SHIELD | Source: Ambulatory Visit | Attending: Radiation Oncology | Admitting: Radiation Oncology

## 2016-01-10 ENCOUNTER — Encounter: Payer: Self-pay | Admitting: Radiation Oncology

## 2016-01-10 ENCOUNTER — Ambulatory Visit: Payer: BLUE CROSS/BLUE SHIELD

## 2016-01-10 DIAGNOSIS — Z510 Encounter for antineoplastic radiation therapy: Secondary | ICD-10-CM | POA: Diagnosis not present

## 2016-01-11 ENCOUNTER — Encounter: Payer: Self-pay | Admitting: *Deleted

## 2016-01-11 ENCOUNTER — Ambulatory Visit: Payer: BLUE CROSS/BLUE SHIELD

## 2016-01-11 NOTE — Progress Notes (Signed)
Maria Allen  Clinical Social Allen was referred by patient for information on [employer] now that she is finished with treatment.  Clinical Social Worker spoke with patient via phone to offer support and assess for needs.  Pt is eager to "take better care" of herself after her treatment. She plans to meet with dietician and is considering other Support Programs now that she has more time. Pt plans to bring medical bills in the near future for submission to [employer] in [place]. Pt in great spirits and plans to reach out as needed.    Clinical Social Allen interventions:  Resource asst  Loren Racer, [place] Clinical Social Worker [place]  [place] Phone: [phone] Fax: [phone]

## 2016-01-14 ENCOUNTER — Telehealth: Payer: Self-pay | Admitting: *Deleted

## 2016-01-14 ENCOUNTER — Ambulatory Visit: Payer: BLUE CROSS/BLUE SHIELD | Admitting: Nutrition

## 2016-01-14 DIAGNOSIS — C50212 Malignant neoplasm of upper-inner quadrant of left female breast: Secondary | ICD-10-CM

## 2016-01-14 NOTE — Telephone Encounter (Signed)
  Oncology Nurse Navigator Documentation  Navigator Location: [facility] (01/14/16 1400) Navigator Encounter Type: Telephone (01/14/16 1400) Telephone: [facility] Call (01/14/16 1400)         Patient Visit Type: [id number] (01/14/16 1400) Treatment Phase: Final Radiation Tx (01/14/16 1400)     Interventions: Referrals (01/14/16 1400) Referrals: Survivorship (01/14/16 1400)                    Time Spent with Patient: 15 (01/14/16 1400)

## 2016-01-14 NOTE — Progress Notes (Signed)
55 year old female diagnosed with breast cancer status post radiation therapy  Past medical history includes migraine and anxiety.  Medications include phentermine.  Labs were reviewed.  Height: 5 feet. Weight: 257.9 pounds. Usual body weight: 260 pounds. BMI: 50.37.  Patient has completed radiation therapy and wants to make improvements in her diet.  Nutrition diagnosis:  Food and nutrition related knowledge deficit related to breast cancer and associated treatments as evidenced by no prior need for nutrition related information.  Intervention: I educated patient on increasing plant-based foods and consuming low-fat, lean proteins.   Encouraged exercise as permitted by physician. Provided fact sheets on plant-based diet and nutrition for breast cancer survivors. Recommended patient attend our next [place] class for additional information. Questions answered.  Teach back method used.  Contact information provided.  Monitoring, evaluation, goals: Patient will increase plant-based foods and exercise to reduce risk for cancer recurrence.  No follow-up required.  Nutrition diagnosis resolved.

## 2016-01-18 NOTE — Progress Notes (Signed)
  Radiation Oncology         [phone] ________________________________  Name: Maria Allen MRN: [id number]  Date: 01/10/2016  DOB: Jun 15, 1960  End of Treatment Note  Diagnosis:   Left-sided breast cancer     Indication for treatment:  Curative       Radiation treatment dates:   11/22/2015 through 01/10/2016  Site/dose:   The patient initially received a dose of 50.4 Gy in 28 fractions to the breast using whole-breast tangent fields. This was delivered using a 3-D conformal technique. The patient then received a boost to the seroma. This delivered an additional 10 Gy in 5 fractions using a en face electron field. The total dose was 60.4 Gy.  Narrative: The patient tolerated radiation treatment relatively well.   The patient had some expected skin irritation as she progressed during treatment. Moist desquamation was not present at the end of treatment.  Plan: The patient has completed radiation treatment. The patient will return to radiation oncology clinic for routine followup in one month. I advised the patient to call or return sooner if they have any questions or concerns related to their recovery or treatment. ________________________________  Jodelle Gross, M.D., Ph.D.

## 2016-01-30 ENCOUNTER — Telehealth: Payer: Self-pay | Admitting: Radiation Oncology

## 2016-01-30 NOTE — Telephone Encounter (Signed)
Returned call to patient concerning 09/28 appt. Did not complete call. Left a voicemail message.

## 2016-01-31 ENCOUNTER — Encounter: Payer: Self-pay | Admitting: Hematology and Oncology

## 2016-01-31 ENCOUNTER — Ambulatory Visit (HOSPITAL_BASED_OUTPATIENT_CLINIC_OR_DEPARTMENT_OTHER): Payer: BLUE CROSS/BLUE SHIELD | Admitting: Hematology and Oncology

## 2016-01-31 DIAGNOSIS — C50212 Malignant neoplasm of upper-inner quadrant of left female breast: Secondary | ICD-10-CM

## 2016-01-31 MED ORDER — ANASTROZOLE 1 MG PO TABS: 1.0000 mg | ORAL_TABLET | Freq: Every day | ORAL | 3 refills | Status: DC

## 2016-01-31 NOTE — Progress Notes (Signed)
Patient Care Team: Hoyt Koch, MD as PCP - General (Internal Medicine)  DIAGNOSIS: Breast cancer of upper-inner quadrant of left female breast [facility])   Staging form: Breast, AJCC 7th Edition   - Clinical stage from 09/05/2015: Stage IA (T1b, N0, M0) - Unsigned         Staging comments: Staged at breast conference on 5.3.17   - Pathologic: No stage assigned - Unsigned  SUMMARY OF ONCOLOGIC HISTORY:   Breast cancer of upper-inner quadrant of left female breast (Post)   08/27/2015 Initial Diagnosis    Left breast biopsy 11:00: Invasive lobular cancer with LCIS, grade 1,ER 80%, PR 95%, Ki-67 10%, HER-2 Negativeratio 0.97, T1 BN 0 stage I a clinical stage      08/27/2015 Mammogram    Screening detected left breast asymmetry posteriorly at 11:00 position 9 x 8 x 5 mm by ultrasound, axilla negative      10/09/2015 Surgery    Left lumpectomy: Invasive lobular carcinoma grade 1, 0.9 cm, ALH, margins negative, 0/3 lymph nodes negative, T1bN0 stage IA, ER 80%, PR 95%, HER-2 negative ratio 0.97, Ki-67 10%, Oncotype DX 21, 14% ROR      11/21/2015 - 01/10/2016 Radiation Therapy    Adjuvant radiation therapy       CHIEF COMPLIANT: Follow-up after radiation therapy to talk about antiestrogen treatments  INTERVAL HISTORY: Maria Allen is a 55 year old with above-mentioned history of left breast cancer currently here to discuss adjuvant antiestrogen therapy. She completed radiation therapy and is healing very well. She reports no new problems or concerns. She tells me that she's changed her diet and is eating healthy.  REVIEW OF SYSTEMS:   Constitutional: Denies fevers, chills or abnormal weight loss Eyes: Denies blurriness of vision Ears, nose, mouth, throat, and face: Denies mucositis or sore throat Respiratory: Denies cough, dyspnea or wheezes Cardiovascular: Denies palpitation, chest discomfort Gastrointestinal:  Denies nausea, heartburn or change in bowel habits Skin: Denies abnormal  skin rashes Lymphatics: Denies new lymphadenopathy or easy bruising Neurological:Denies numbness, tingling or new weaknesses Behavioral/Psych: Mood is stable, no new changes  Extremities: No lower extremity edema Breast:  denies any pain or lumps or nodules in either breasts All other systems were reviewed with the patient and are negative.  I have reviewed the past medical history, past surgical history, social history and family history with the patient and they are unchanged from previous note.  ALLERGIES:  is allergic to gadolinium derivatives.  MEDICATIONS:  Current Outpatient Prescriptions  Medication Sig Dispense Refill  . aspirin 325 MG tablet Take 650 mg by mouth daily as needed for moderate pain or headache. Reported on 09/05/2015    . hyaluronate sodium (RADIAPLEXRX) GEL Apply 1 application topically once.    . non-metallic deodorant Jethro Poling) MISC Apply 1 application topically daily as needed.    Marland Kitchen oseltamivir (TAMIFLU) 75 MG capsule Take 75 mg by mouth.  0  . phentermine 37.5 MG capsule Take 37.5 mg by mouth every morning.     No current facility-administered medications for this visit.     PHYSICAL EXAMINATION: ECOG PERFORMANCE STATUS: 0 - Asymptomatic  Vitals:   01/31/16 0920  BP: 137/71  Pulse: 95  Resp: 18  Temp: 98 F (36.7 C)   Filed Weights   01/31/16 0920  Weight: 254 lb 14.4 oz (115.6 kg)    GENERAL:alert, no distress and comfortable SKIN: skin color, texture, turgor are normal, no rashes or significant lesions EYES: normal, Conjunctiva are pink and non-injected, sclera  clear OROPHARYNX:no exudate, no erythema and lips, buccal mucosa, and tongue normal  NECK: supple, thyroid normal size, non-tender, without nodularity LYMPH:  no palpable lymphadenopathy in the cervical, axillary or inguinal LUNGS: clear to auscultation and percussion with normal breathing effort HEART: regular rate & rhythm and no murmurs and no lower extremity edema ABDOMEN:abdomen  soft, non-tender and normal bowel sounds MUSCULOSKELETAL:no cyanosis of digits and no clubbing  NEURO: alert & oriented x 3 with fluent speech, no focal motor/sensory deficits EXTREMITIES: No lower extremity edema BREAST: No palpable masses or nodules in either right or left breasts. No palpable axillary supraclavicular or infraclavicular adenopathy no breast tenderness or nipple discharge. (exam performed in the presence of a chaperone)  LABORATORY DATA:  I have reviewed the data as listed   Chemistry      Component Value Date/Time   NA 139 09/05/2015 1302   K 4.2 09/05/2015 1302   CL 106 08/02/2014 0929   CO2 26 09/05/2015 1302   BUN 16.6 09/05/2015 1302   CREATININE 0.9 09/05/2015 1302      Component Value Date/Time   CALCIUM 10.2 09/05/2015 1302   ALKPHOS 58 09/05/2015 1302   AST 18 09/05/2015 1302   ALT 13 09/05/2015 1302   BILITOT 0.51 09/05/2015 1302       Lab Results  Component Value Date   WBC 5.8 10/04/2015   HGB 13.4 10/04/2015   HCT 42.6 10/04/2015   MCV 89.3 10/04/2015   PLT 347 10/04/2015   NEUTROABS 2.2 09/05/2015     ASSESSMENT & PLAN:  Breast cancer of upper-inner quadrant of left female breast (HCC) Left lumpectomy: Invasive lobular carcinoma grade 1, 0.9 cm, ALH, margins negative, 0/3 lymph nodes negative, T1bN0 stage IA, ER 80%, PR 95%, HER-2 negative ratio 0.97, Ki-67 10% Oncotype DX score 21, 14% risk of recurrence intermediate risk  Adjuvant radiation therapy from 11/21/2015 to 01/10/2016  Recommendation: Antiestrogen therapy with anastrozole 1 mg by mouth daily 5 years Anastrozole counseling:We discussed the risks and benefits of anti-estrogen therapy with aromatase inhibitors. These include but not limited to insomnia, hot flashes, mood changes, vaginal dryness, bone density loss, and weight gain. We strongly believe that the benefits far outweigh the risks. Patient understands these risks and consented to starting treatment. Planned treatment  duration is 5-10 years.  Return to clinic in 3 months for toxicity check and follow-up.    No orders of the defined types were placed in this encounter.  The patient has a good understanding of the overall plan. she agrees with it. she will call with any problems that may develop before the next visit here.   Rulon Eisenmenger, MD 01/31/16

## 2016-01-31 NOTE — Assessment & Plan Note (Signed)
Left lumpectomy: Invasive lobular carcinoma grade 1, 0.9 cm, ALH, margins negative, 0/3 lymph nodes negative, T1bN0 stage IA, ER 80%, PR 95%, HER-2 negative ratio 0.97, Ki-67 10% Oncotype DX score 21, 14% risk of recurrence intermediate risk  Adjuvant radiation therapy from 11/21/2015 to 01/10/2016  Recommendation: Antiestrogen therapy with anastrozole 1 mg by mouth daily 5 years Anastrozole counseling:We discussed the risks and benefits of anti-estrogen therapy with aromatase inhibitors. These include but not limited to insomnia, hot flashes, mood changes, vaginal dryness, bone density loss, and weight gain. We strongly believe that the benefits far outweigh the risks. Patient understands these risks and consented to starting treatment. Planned treatment duration is 5-10 years.  Return to clinic in 3 months for toxicity check and follow-up.

## 2016-02-12 ENCOUNTER — Encounter: Payer: Self-pay | Admitting: Radiation Oncology

## 2016-02-12 ENCOUNTER — Ambulatory Visit
Admission: RE | Admit: 2016-02-12 | Discharge: 2016-02-12 | Disposition: A | Discharge status: Home / Self Care | Payer: BLUE CROSS/BLUE SHIELD | Source: Ambulatory Visit | Attending: Radiation Oncology | Admitting: Radiation Oncology

## 2016-02-12 VITALS — BP 143/88 | HR 97 | Temp 98.0°F | Ht 60.0 in | Wt 250.8 lb

## 2016-02-12 DIAGNOSIS — C50912 Malignant neoplasm of unspecified site of left female breast: Secondary | ICD-10-CM | POA: Insufficient documentation

## 2016-02-12 DIAGNOSIS — R634 Abnormal weight loss: Secondary | ICD-10-CM | POA: Diagnosis not present

## 2016-02-12 DIAGNOSIS — Z79899 Other long term (current) drug therapy: Secondary | ICD-10-CM | POA: Diagnosis not present

## 2016-02-12 DIAGNOSIS — Z7982 Long term (current) use of aspirin: Secondary | ICD-10-CM | POA: Diagnosis not present

## 2016-02-12 DIAGNOSIS — Z170 Estrogen receptor positive status [ER+]: Secondary | ICD-10-CM | POA: Diagnosis present

## 2016-02-12 DIAGNOSIS — C50212 Malignant neoplasm of upper-inner quadrant of left female breast: Secondary | ICD-10-CM

## 2016-02-12 DIAGNOSIS — Z79811 Long term (current) use of aromatase inhibitors: Secondary | ICD-10-CM | POA: Diagnosis not present

## 2016-02-12 NOTE — Progress Notes (Signed)
Radiation Oncology         [phone] ________________________________  Name: Maria Allen MRN: [id number]  Date: 02/12/2016  DOB: [birth year]  Post Treatment Note  CC: Hoyt Koch, MD  Stark Klein, MD  Diagnosis:   Stage IA, T1b, N0, ER/PR positive, HER2 negative  Invasive lobular carcinoma of the left breast.   Interval Since Last Radiation:  5 weeks   11/22/2015 through 01/10/2016: The patient initially received a dose of 50.4 Gy in 28 fractions to the breast using whole-breast tangent fields. This was delivered using a 3-D conformal technique. The patient then received a boost to the seroma. This delivered an additional 10 Gy in 5 fractions using a en face electron field. The total dose was 60.4 Gy.  Narrative:  The patient returns today for routine follow-up.  She did well with radiotherapy and has started Arimidex. She has also started a rigorous diet and exercise program and has been successful in 4 pounds of weight loss in the last few weeks.                               On review of systems, the patient states she is doing great. She is excited about her weight loss. She is using vitamin e cream on her skin. She denies any chest pain or shortness of breath, or concerns with her skin. No other complaints are noted.  ALLERGIES:  is allergic to gadolinium derivatives.  Meds: Current Outpatient Prescriptions  Medication Sig Dispense Refill  . anastrozole (ARIMIDEX) 1 MG tablet Take 1 tablet (1 mg total) by mouth daily. 90 tablet 3  . aspirin 325 MG tablet Take 650 mg by mouth daily as needed for moderate pain or headache. Reported on 09/05/2015    . calcium gluconate 500 MG tablet Take 1 tablet by mouth 2 (two) times daily.    . cholecalciferol (VITAMIN D) 1000 units tablet Take 1,000 Units by mouth daily.    . hyaluronate sodium (RADIAPLEXRX) GEL Apply 1 application topically once.    . non-metallic deodorant Jethro Poling) MISC Apply 1 application topically daily as needed.     Marland Kitchen oseltamivir (TAMIFLU) 75 MG capsule Take 75 mg by mouth.  0  . phentermine 37.5 MG capsule Take 37.5 mg by mouth every morning.    . TURMERIC PO Take 1 tablet by mouth.     No current facility-administered medications for this encounter.     Physical Findings:  height is 5' (1.524 m) and weight is 250 lb 12.8 oz (113.8 kg). Her oral temperature is 98 F (36.7 C). Her blood pressure is 143/88 (abnormal) and her pulse is 97.  In general this is a well appearing African American female in no acute distress. She's alert and oriented x4 and appropriate throughout the examination. Cardiopulmonary assessment is negative for acute distress and she exhibits normal effort. The left breast reveals hyperpigmentation without desquamation.   Lab Findings: Lab Results  Component Value Date   WBC 5.8 10/04/2015   HGB 13.4 10/04/2015   HCT 42.6 10/04/2015   MCV 89.3 10/04/2015   PLT 347 10/04/2015     Radiographic Findings: No results found.  Impression/Plan: 1. Stage IA, T1b, N0, ER/PR positive, HER2 negative  Invasive lobular carcinoma of the left breast. The patient is doing well. She will continue arimidex and follow up with Dr. Lindi Adie. We would be happy to see her back as needed moving forward. 2.  Survivorship. The patient has a scheduled appointment in November with Mike Craze, NP. She was encouraged to attend this visit, and was given a calendar for this month's activities at the cancer center.      Carola Rhine, PAC

## 2016-02-12 NOTE — Progress Notes (Signed)
Maria Allen reports that she started Arimidex on 02/05/16 and also taking Calcium and Vitamin D  She reports intermittent achy/throbbing sensation in her left breast with occassional itching. Note dryness near the areola region, lateraaly.  Hyperpigmentation of breast and inframmary fold.  Skin  intact. She denies any fatigue since starting phentermine for weight loss - decrease in weight by 4 lbs since 01/31/16.  Walking with goal of 5 days weekly(8 laps) since the end of XRT. Survivorship Encounter scheduled for 03/19/16  BP (!) 143/88   Pulse 97   Temp 98 F (36.7 C) (Oral)   Ht 5' (1.524 m)   Wt 250 lb 12.8 oz (113.8 kg)   BMI 48.98 kg/m    Wt Readings from Last 3 Encounters:  02/12/16 250 lb 12.8 oz (113.8 kg)  01/31/16 254 lb 14.4 oz (115.6 kg)  01/09/16 257 lb 14.4 oz (117 kg)

## 2016-03-05 ENCOUNTER — Telehealth: Payer: Self-pay

## 2016-03-05 NOTE — Telephone Encounter (Signed)
Called and informed pt paperwork for disability was finished, pt requested it be mailed to her.   Paperwork placed in mailbox.

## 2016-03-19 ENCOUNTER — Ambulatory Visit (HOSPITAL_BASED_OUTPATIENT_CLINIC_OR_DEPARTMENT_OTHER): Payer: BLUE CROSS/BLUE SHIELD | Admitting: Adult Health

## 2016-03-19 VITALS — BP 110/90 | HR 92 | Temp 97.7°F | Resp 18 | Ht 60.0 in | Wt 244.1 lb

## 2016-03-19 DIAGNOSIS — N898 Other specified noninflammatory disorders of vagina: Secondary | ICD-10-CM

## 2016-03-19 DIAGNOSIS — Z170 Estrogen receptor positive status [ER+]: Secondary | ICD-10-CM | POA: Diagnosis not present

## 2016-03-19 DIAGNOSIS — Z79811 Long term (current) use of aromatase inhibitors: Secondary | ICD-10-CM

## 2016-03-19 DIAGNOSIS — C50212 Malignant neoplasm of upper-inner quadrant of left female breast: Secondary | ICD-10-CM | POA: Diagnosis not present

## 2016-03-19 DIAGNOSIS — Z780 Asymptomatic menopausal state: Secondary | ICD-10-CM

## 2016-03-28 ENCOUNTER — Encounter: Payer: Self-pay | Admitting: Adult Health

## 2016-03-28 NOTE — Progress Notes (Signed)
CLINIC:  Survivorship   REASON FOR VISIT:  Routine follow-up post-treatment for a recent history of breast cancer.  BRIEF ONCOLOGIC HISTORY:    Breast cancer of upper-inner quadrant of left female breast ([place])   08/27/2015 Initial Diagnosis    Left breast biopsy 11:00: Invasive lobular cancer with LCIS, grade 1,ER 80%, PR 95%, Ki-67 10%, HER-2 Negativeratio 0.97, T1 BN 0 stage I a clinical stage      08/27/2015 Mammogram    Screening detected left breast asymmetry posteriorly at 11:00 position 9 x 8 x 5 mm by ultrasound, axilla negative      10/09/2015 Surgery    Left lumpectomy Barry Dienes): Invasive lobular carcinoma grade 1, 0.9 cm, ALH, margins negative, 0/3 lymph nodes negative, T1bN0 stage IA, ER 80%, PR 95%, HER-2 negative ratio 0.97, Ki-67 10%, Oncotype DX 21, 14% ROR      10/09/2015 Oncotype testing    Recurrence score: 21; 14% ROR (intermediate-risk)       11/21/2015 - 01/10/2016 Radiation Therapy    Adjuvant radiation therapy Lisbeth Renshaw). Left breast: 50.4 Gy in 28 fractions. Left breast boost: 10 Gy in 5 fractions       02/2016 -  Anti-estrogen oral therapy    Anastrozole 1 mg daily. Planned duration of therapy: 5 years        INTERVAL HISTORY:  Maria Allen presents to the [facility] today for our initial meeting to review her survivorship care plan detailing her treatment course for breast cancer, as well as monitoring long-term side effects of that treatment, education regarding health maintenance, screening, and overall wellness and health promotion.     Overall, Maria Allen reports feeling quite well since completing her radiation therapy approximately 2 months ago.  She started the Anastrozole in 02/2016 and is tolerating it relatively well.  She reports having some hot flashes, mostly during the day; the hot flashes are currently manageable for her. She has some arthralgias, which she denies any pain and causes more "soreness."    She is concerned about the  possibility of lymphedema in her left arm.  She denies any current arm swelling, but would like recommendations on what to do for the future.   She reports having a tan/dark yellow vaginal discharge for the past 2-3 weeks. She notes a mild odor with the vaginal discharge and mild vaginal discomfort. She denies any vaginal itching.  She is walking 4-5 times a week, and has intentionally lost about 30 pounds since April. She is working hard on trying to eat more healthy foods.      REVIEW OF SYSTEMS: Review of Systems  Constitutional: Negative.   HENT:  Negative.   Eyes: Negative.   Respiratory: Negative.   Cardiovascular: Negative.   Gastrointestinal: Negative.   Endocrine: Positive for hot flashes.  Genitourinary: Positive for vaginal discharge. Negative for vaginal bleeding.   Musculoskeletal: Positive for arthralgias.  Skin: Negative.   Neurological: Negative.   Hematological: Negative.   Psychiatric/Behavioral: Negative.   Breast: Denies any new nodularity, masses, tenderness, nipple changes, or nipple discharge.    A 14-point review of systems was completed and was negative, except as noted above.   ONCOLOGY TREATMENT TEAM:  1. Surgeon:  Dr.Byerly at [facility] 2. Medical Oncologist: Dr. Lindi Adie 3. Radiation Oncologist: Dr. Lisbeth Renshaw    PAST MEDICAL/SURGICAL HISTORY:  Past Medical History:  Diagnosis Date  . Anxiety   . Breast cancer ([place]) 08/27/15   left  . Breast cancer of upper-inner quadrant of left  female breast ([place]) 08/29/2015  . Migraine    Past Surgical History:  Procedure Laterality Date  . BREAST LUMPECTOMY WITH RADIOACTIVE SEED AND SENTINEL LYMPH NODE BIOPSY Left 10/09/2015   Procedure: BREAST LUMPECTOMY WITH RADIOACTIVE SEED AND SENTINEL LYMPH NODE BIOPSY;  Surgeon: Stark Klein, MD;  Location: [place];  Service: General;  Laterality: Left;  . COLONOSCOPY WITH PROPOFOL N/A 11/13/2014   Procedure: COLONOSCOPY WITH PROPOFOL;  Surgeon: Jerene Bears,  MD;  Location: [facility];  Service: Gastroenterology;  Laterality: N/A;  . NO PAST SURGERIES       ALLERGIES:  Allergies  Allergen Reactions  . Gadolinium Derivatives Nausea Only    Contrast agent     CURRENT MEDICATIONS:  Outpatient Encounter Prescriptions as of 03/19/2016  Medication Sig Note  . anastrozole (ARIMIDEX) 1 MG tablet Take 1 tablet (1 mg total) by mouth daily.   Marland Kitchen aspirin 325 MG tablet Take 650 mg by mouth daily as needed for moderate pain or headache. Reported on 09/05/2015   . calcium gluconate 500 MG tablet Take 1 tablet by mouth 2 (two) times daily.   . cholecalciferol (VITAMIN D) 1000 units tablet Take 1,000 Units by mouth daily.   . hyaluronate sodium (RADIAPLEXRX) GEL Apply 1 application topically once. 12/21/2015: 2nd tube given 12/21/15  . non-metallic deodorant (ALRA) MISC Apply 1 application topically daily as needed. 12/07/2015: 2nd tube given 12/07/15  . oseltamivir (TAMIFLU) 75 MG capsule Take 75 mg by mouth. 01/04/2016: Will take today 01/04/16  . phentermine 37.5 MG capsule Take 37.5 mg by mouth every morning.   . TURMERIC PO Take 1 tablet by mouth.    No facility-administered encounter medications on file as of 03/19/2016.      ONCOLOGIC FAMILY HISTORY:  Family History  Problem Relation Age of Onset  . Diabetes Mother   . Alcohol abuse Father   . Stroke Father   . Mental illness Father   . Breast cancer Paternal Aunt   . Colon cancer Neg Hx   . Colon polyps Neg Hx   . Rectal cancer Neg Hx   . Stomach cancer Neg Hx      GENETIC COUNSELING/TESTING: None.   SOCIAL HISTORY:  Maria Allen is single and lives alone in [place], [name].   She does not have any children.  She denies any current or history of tobacco, alcohol, or illicit drug use.     PHYSICAL EXAMINATION:  Vital Signs:   Vitals:   03/19/16 0936  BP: 110/90  Pulse: 92  Resp: 18  Temp: 97.7 F (36.5 C)   Filed Weights   03/19/16 0936  Weight: 244 lb 1.6 oz (110.7 kg)    General: Well-nourished, well-appearing female in no acute distress.  She is unaccompanied today.   HEENT: Head is normocephalic.  Pupils equal and reactive to light. Conjunctivae clear without exudate.  Sclerae anicteric. Oral mucosa is pink, moist.  Oropharynx is pink without lesions or erythema.  Lymph: No cervical, supraclavicular, or infraclavicular lymphadenopathy noted on palpation.  Cardiovascular: Regular rate and rhythm.Marland Kitchen Respiratory: Clear to auscultation bilaterally. Chest expansion symmetric; breathing non-labored.  GI: Abdomen soft and round; non-tender, non-distended. Bowel sounds normoactive.  GU: Deferred.  Neuro: No focal deficits. Steady gait.  Psych: Mood and affect normal and appropriate for situation.  Extremities: No edema. Skin: Warm and dry.  LABORATORY DATA:  None for this visit.  DIAGNOSTIC IMAGING:  None for this visit.      ASSESSMENT AND PLAN:  Ms.. Allen  is a pleasant 55 y.o. female with Stage IA left breast invasive lobular carcinoma, ER+/PR+/HER2-, diagnosed in 08/2015;  treated with lumpectomy, adjuvant radiation therapy, and anti-estrogen therapy with Anastrozole beginning in 02/2016.  She presents to the Survivorship Clinic for our initial meeting and routine follow-up post-completion of treatment for breast cancer.    1. Stage IA left breast cancer:  Maria Allen is continuing to recover from definitive treatment for breast cancer. She will follow-up with her medical oncologist, Dr. Lindi Adie in 04/2016 with history and physical exam per surveillance protocol.  She will continue her anti-estrogen therapy with Anastrozole. Thus far, she is tolerating the medication well, with minimal side effects. She was instructed to make Dr. Lindi Adie or myself aware if she begins to experience any worsening side effects of the medication and I could see her back in clinic to help manage those side effects, as needed. Common side effects of anastrozole were again reviewed with  her as well. Today, a comprehensive survivorship care plan and treatment summary was reviewed with the patient today detailing her breast cancer diagnosis, treatment course, potential late/long-term effects of treatment, appropriate follow-up care with recommendations for the future, and patient education resources.  A copy of this summary, along with a letter will be sent to the patient's primary care provider via mail/fax/In Basket message after today's visit.    2. Vaginal discharge: Based on her reported symptoms, this seems unrelated to her anastrozole or history of breast cancer.  She denies any vaginal bleeding or dark brown vaginal discharge (which may suggest old blood).  We discussed that certainly sexual activity can lead to bacterial vaginosis infections, which will sometimes cause discharge and associated odor.  She tells me her last sexual encounter was about 1 month ago.  She does have a gynecologist and I encouraged her to follow-up with them if the symptoms do not resolve.  We discussed that changes in vaginal hormones with menopause can lead to vaginal dryness, which can lead to pain with intercourse.  I recommended that she try coconut oil for vaginal dryness.  She can use the coconut oil as a vaginal moisturizer, as well as a lubricant for intercourse. I gave her instructions on how to use the coconut oil and again recommended that she follow-up with her gynecologist if her symptoms do not resolve on their own.  She agreed with this plan.   3. At risk for left arm lymphedema:  Breast cancer survivors who required lymph node removal as part of their treatment are at increased risk of lymphedema. We discussed that her risk is likely pretty low, given that she only had to have 3 axillary lymph nodes removed at the time of surgery.  However, prevention is the key to lymphedema.  I recommended that she get a lymphedema sleeve for her left arm, particularly to use when flying in an airplane.  I  gave her a paper prescription for a lymphedema sleeve today.    4. Bone health:  Given Maria Allen's age, history of breast cancer, and her current treatment regimen including anti-estrogen therapy with anastrozole, she is at risk for bone demineralization.  We do not have records of a DEXA scan for her. Therefore, we discussed having her get a baseline DEXA scan soon; orders placed today.  In the meantime, she was encouraged to increase her consumption of foods rich in calcium, as well as increase her weight-bearing activities.  She was given education on specific activities to promote bone health.  5. Cancer screening:  Due to Maria Allen's history and her age, she should receive screening for skin cancers, colon cancer, and gynecologic cancers.  The information and recommendations are listed on the patient's comprehensive care plan/treatment summary and were reviewed in detail with the patient.    6. Health maintenance and wellness promotion: Maria Allen was encouraged to consume 5-7 servings of fruits and vegetables per day. We reviewed the "Nutrition Rainbow" handout, as well as the handout "Take Control of Your Health and Reduce Your Cancer Risk" from the [employer].  She was also encouraged to engage in moderate to vigorous exercise for 30 minutes per day most days of the week. We discussed the [employer] fitness program, which is designed for cancer survivors to help them become more physically fit after cancer treatments.  She was instructed to limit her alcohol consumption and continue to abstain from tobacco use.   7. Support services/counseling: It is not uncommon for this period of the patient's cancer care trajectory to be one of many emotions and stressors.  We discussed an opportunity for her to participate in the next session of [facility] ("Finding Your New Normal") support group series designed for patients after they have completed treatment.   Maria Allen was encouraged to take  advantage of our many other support services programs, support groups, and/or counseling in coping with her new life as a cancer survivor after completing anti-cancer treatment.  She was offered support today through active listening and expressive supportive counseling.  She was given information regarding our available services and encouraged to contact me with any questions or for help enrolling in any of our support group/programs.    Dispo:   -Baseline DEXA scan due; orders placed today.  -Return to cancer center to see Dr. Lindi Adie in 04/2016.  -She is welcome to return back to the Survivorship Clinic at any time; no additional follow-up needed at this time.  -Consider referral back to survivorship as a long-term survivor for continued surveillance  A total of 50 minutes of face-to-face time was spent with this patient with greater than 50% of that time in counseling and care-coordination.   Mike Craze, NP Survivorship Program [place] [phone]   Note: PRIMARY CARE PROVIDER Hoyt Koch, [place] [phone]

## 2016-04-03 ENCOUNTER — Telehealth: Payer: Self-pay

## 2016-04-03 NOTE — Telephone Encounter (Signed)
Faxed disability forms to [employer]

## 2016-04-21 ENCOUNTER — Ambulatory Visit (HOSPITAL_BASED_OUTPATIENT_CLINIC_OR_DEPARTMENT_OTHER): Payer: BLUE CROSS/BLUE SHIELD | Admitting: Hematology and Oncology

## 2016-04-21 ENCOUNTER — Encounter: Payer: Self-pay | Admitting: Hematology and Oncology

## 2016-04-21 DIAGNOSIS — C50212 Malignant neoplasm of upper-inner quadrant of left female breast: Secondary | ICD-10-CM | POA: Diagnosis not present

## 2016-04-21 DIAGNOSIS — Z170 Estrogen receptor positive status [ER+]: Secondary | ICD-10-CM | POA: Diagnosis not present

## 2016-04-21 DIAGNOSIS — Z79811 Long term (current) use of aromatase inhibitors: Secondary | ICD-10-CM | POA: Diagnosis not present

## 2016-04-21 NOTE — Assessment & Plan Note (Signed)
Left lumpectomy: Invasive lobular carcinoma grade 1, 0.9 cm, ALH, margins negative, 0/3 lymph nodes negative, T1bN0 stage IA, ER 80%, PR 95%, HER-2 negative ratio 0.97, Ki-67 10% Oncotype DX score 21, 14% risk of recurrence intermediate risk  Adjuvant radiation therapy from 11/21/2015 to 01/10/2016  Current treatment: Antiestrogen therapy with anastrozole 1 mg by mouth daily 5 years  Anastrozole toxicities:  Return to clinic in 6 months for follow-up

## 2016-04-21 NOTE — Progress Notes (Signed)
Patient Care Team: Hoyt Koch, MD as PCP - General (Internal Medicine)  DIAGNOSIS:  Encounter Diagnosis  Name Primary?  . Malignant neoplasm of upper-inner quadrant of left breast in female, estrogen receptor positive ([place])     SUMMARY OF ONCOLOGIC HISTORY:   Breast cancer of upper-inner quadrant of left female breast ([place])   08/27/2015 Initial Diagnosis    Left breast biopsy 11:00: Invasive lobular cancer with LCIS, grade 1,ER 80%, PR 95%, Ki-67 10%, HER-2 Negativeratio 0.97, T1 BN 0 stage I a clinical stage      08/27/2015 Mammogram    Screening detected left breast asymmetry posteriorly at 11:00 position 9 x 8 x 5 mm by ultrasound, axilla negative      10/09/2015 Surgery    Left lumpectomy Barry Dienes): Invasive lobular carcinoma grade 1, 0.9 cm, ALH, margins negative, 0/3 lymph nodes negative, T1bN0 stage IA, ER 80%, PR 95%, HER-2 negative ratio 0.97, Ki-67 10%, Oncotype DX 21, 14% ROR      10/09/2015 Oncotype testing    Recurrence score: 21; 14% ROR (intermediate-risk)       11/21/2015 - 01/10/2016 Radiation Therapy    Adjuvant radiation therapy Lisbeth Renshaw). Left breast: 50.4 Gy in 28 fractions. Left breast boost: 10 Gy in 5 fractions       02/2016 -  Anti-estrogen oral therapy    Anastrozole 1 mg daily. Planned duration of therapy: 5 years      UL  CHIEF COMPLIANT: Follow-up on anastrozole  INTERVAL HISTORY: Maria Allen is a 55 year old with above-mentioned history of left breast invasive lobular cancer treated with lumpectomy and radiation therapy. She is currently on anastrozole. She is here for toxicity evaluation. She has intermittent hot flashes and muscle aches and pains but they're manageable She loves to walk outdoors and exercises every day.  REVIEW OF SYSTEMS:   Constitutional: Denies fevers, chills or abnormal weight loss Eyes: Denies blurriness of vision Ears, nose, mouth, throat, and face: Denies mucositis or sore throat Respiratory: Denies cough,  dyspnea or wheezes Cardiovascular: Denies palpitation, chest discomfort Gastrointestinal:  Denies nausea, heartburn or change in bowel habits Skin: Denies abnormal skin rashes Lymphatics: Denies new lymphadenopathy or easy bruising Neurological:Denies numbness, tingling or new weaknesses Behavioral/Psych: Mood is stable, no new changes  Extremities: No lower extremity edema Breast:  denies any pain or lumps or nodules in either breasts All other systems were reviewed with the patient and are negative.  I have reviewed the past medical history, past surgical history, social history and family history with the patient and they are unchanged from previous note.  ALLERGIES:  is allergic to gadolinium derivatives.  MEDICATIONS:  Current Outpatient Prescriptions  Medication Sig Dispense Refill  . anastrozole (ARIMIDEX) 1 MG tablet Take 1 tablet (1 mg total) by mouth daily. 90 tablet 3  . aspirin 325 MG tablet Take 650 mg by mouth daily as needed for moderate pain or headache. Reported on 09/05/2015    . calcium gluconate 500 MG tablet Take 1 tablet by mouth 2 (two) times daily.    . cholecalciferol (VITAMIN D) 1000 units tablet Take 1,000 Units by mouth daily.    . hyaluronate sodium (RADIAPLEXRX) GEL Apply 1 application topically once.    . non-metallic deodorant Jethro Poling) MISC Apply 1 application topically daily as needed.    Marland Kitchen oseltamivir (TAMIFLU) 75 MG capsule Take 75 mg by mouth.  0  . phentermine 37.5 MG capsule Take 37.5 mg by mouth every morning.    . TURMERIC PO  Take 1 tablet by mouth.     No current facility-administered medications for this visit.     PHYSICAL EXAMINATION: ECOG PERFORMANCE STATUS: 1 - Symptomatic but completely ambulatory  Vitals:   04/21/16 0937  BP: (!) 143/87  Pulse: 89  Resp: 18  Temp: 98.1 F (36.7 C)   Filed Weights   04/21/16 0937  Weight: 244 lb 6.4 oz (110.9 kg)    GENERAL:alert, no distress and comfortable SKIN: skin color, texture, turgor  are normal, no rashes or significant lesions EYES: normal, Conjunctiva are pink and non-injected, sclera clear OROPHARYNX:no exudate, no erythema and lips, buccal mucosa, and tongue normal  NECK: supple, thyroid normal size, non-tender, without nodularity LYMPH:  no palpable lymphadenopathy in the cervical, axillary or inguinal LUNGS: clear to auscultation and percussion with normal breathing effort HEART: regular rate & rhythm and no murmurs and no lower extremity edema ABDOMEN:abdomen soft, non-tender and normal bowel sounds MUSCULOSKELETAL:no cyanosis of digits and no clubbing  NEURO: alert & oriented x 3 with fluent speech, no focal motor/sensory deficits EXTREMITIES: No lower extremity edema  LABORATORY DATA:  I have reviewed the data as listed   Chemistry      Component Value Date/Time   NA 139 09/05/2015 1302   K 4.2 09/05/2015 1302   CL 106 08/02/2014 0929   CO2 26 09/05/2015 1302   BUN 16.6 09/05/2015 1302   CREATININE 0.9 09/05/2015 1302      Component Value Date/Time   CALCIUM 10.2 09/05/2015 1302   ALKPHOS 58 09/05/2015 1302   AST 18 09/05/2015 1302   ALT 13 09/05/2015 1302   BILITOT 0.51 09/05/2015 1302       Lab Results  Component Value Date   WBC 5.8 10/04/2015   HGB 13.4 10/04/2015   HCT 42.6 10/04/2015   MCV 89.3 10/04/2015   PLT 347 10/04/2015   NEUTROABS 2.2 09/05/2015    ASSESSMENT & PLAN:  Breast cancer of upper-inner quadrant of left female breast ([facility]) Left lumpectomy: Invasive lobular carcinoma grade 1, 0.9 cm, ALH, margins negative, 0/3 lymph nodes negative, T1bN0 stage IA, ER 80%, PR 95%, HER-2 negative ratio 0.97, Ki-67 10% Oncotype DX score 21, 14% risk of recurrence intermediate risk  Adjuvant radiation therapy from 11/21/2015 to 01/10/2016  Current treatment: Antiestrogen therapy with anastrozole 1 mg by mouth daily 5 years  Anastrozole toxicities: 1. Intermittent hot flashes 2. muscle aches and pains that are  manageable  Instructed the patient to take it at bedtime She is taking tumeric We talked about tonic water  Constipation: Instructed the patient to take Colace every day. Return to clinic in 6 months for follow-up  No orders of the defined types were placed in this encounter.  The patient has a good understanding of the overall plan. she agrees with it. she will call with any problems that may develop before the next visit here.   Rulon Eisenmenger, MD 04/21/16

## 2016-04-22 ENCOUNTER — Ambulatory Visit
Admission: RE | Admit: 2016-04-22 | Discharge: 2016-04-22 | Disposition: A | Discharge status: Home / Self Care | Payer: BLUE CROSS/BLUE SHIELD | Source: Ambulatory Visit | Attending: Adult Health | Admitting: Adult Health

## 2016-04-22 DIAGNOSIS — Z79811 Long term (current) use of aromatase inhibitors: Secondary | ICD-10-CM

## 2016-04-22 DIAGNOSIS — Z780 Asymptomatic menopausal state: Secondary | ICD-10-CM

## 2016-04-23 ENCOUNTER — Telehealth: Payer: Self-pay | Admitting: *Deleted

## 2016-04-23 NOTE — Telephone Encounter (Signed)
Called to let pt know that her bone density was normal. No further concerns. Message to be fwd to G. Dawson,NP.

## 2016-05-13 ENCOUNTER — Ambulatory Visit (INDEPENDENT_AMBULATORY_CARE_PROVIDER_SITE_OTHER): Payer: BLUE CROSS/BLUE SHIELD | Admitting: Internal Medicine

## 2016-05-13 ENCOUNTER — Encounter: Payer: Self-pay | Admitting: Internal Medicine

## 2016-05-13 DIAGNOSIS — J209 Acute bronchitis, unspecified: Secondary | ICD-10-CM

## 2016-05-13 MED ORDER — AZITHROMYCIN 250 MG PO TABS: ORAL_TABLET | ORAL | 0 refills | Status: DC

## 2016-05-13 NOTE — Progress Notes (Signed)
   Subjective:    Patient ID: Maria Allen, female    DOB: 05-13-1960, 56 y.o.   MRN: [id number]  HPI The patient is a 56 YO female coming in for sinus and cold symptoms. Going on for about 8 days and overall worsening. Lots of cough and lung congestion. The nose symptoms started off more and now she feels those are improving. Some hoarseness and sore throat. No ear or sinus pain or pressure. No fevers but some chills. No SOB with exertion. Coughing is during the day and not keeping her up at night time. Not taking any cold medications. Taking some mucinex which seems like it breaks up her congestion some and helps her to expectorate it. Coughing up green and yellow mucus still.   Review of Systems  Constitutional: Positive for activity change and chills. Negative for appetite change, diaphoresis, fatigue, fever and unexpected weight change.  HENT: Positive for congestion, postnasal drip, sore throat and voice change. Negative for ear discharge, ear pain, rhinorrhea, sinus pain and sinus pressure.   Eyes: Negative.   Respiratory: Positive for cough and chest tightness. Negative for shortness of breath and wheezing.   Cardiovascular: Negative.   Gastrointestinal: Negative.   Musculoskeletal: Negative.       Objective:   Physical Exam  Constitutional: She is oriented to person, place, and time. She appears well-developed and well-nourished.  HENT:  Head: Normocephalic and atraumatic.  Right Ear: External ear normal.  Left Ear: External ear normal.  Oropharynx with redness and clear drainage, nose without crusting.   Eyes: EOM are normal.  Neck: Normal range of motion.  Cardiovascular: Normal rate and regular rhythm.   Pulmonary/Chest: Effort normal. No respiratory distress. She has no wheezes.  Some rhonchi which partially clear with cough.   Abdominal: Soft. She exhibits no distension. There is no tenderness. There is no rebound.  Lymphadenopathy:    She has no cervical adenopathy.    Neurological: She is alert and oriented to person, place, and time.  Skin: Skin is warm and dry.   Vitals:   05/13/16 0933  BP: (!) 160/94  Pulse: 97  Resp: 16  Temp: 98.5 F (36.9 C)  TempSrc: Oral  SpO2: 99%  Weight: 249 lb (112.9 kg)  Height: 5' (1.524 m)      Assessment & Plan:

## 2016-05-13 NOTE — Progress Notes (Signed)
Pre visit review using our clinic review tool, if applicable. No additional management support is needed unless otherwise documented below in the visit note. 

## 2016-05-13 NOTE — Patient Instructions (Addendum)
We would recommend to start zyrtec (generic name is cetirizine) take 1 pill daily for the next 1-2 weeks.   We have sent in azithromycin to help. Take 2 pills today then 1 pill a day until gone.

## 2016-05-13 NOTE — Assessment & Plan Note (Signed)
Rx for azithromycin and advised to start zyrtec to help with drainage.

## 2016-05-14 ENCOUNTER — Encounter: Payer: Self-pay | Admitting: Hematology and Oncology

## 2016-05-14 NOTE — Progress Notes (Signed)
Called [employer] to verify fax number.   Faxed disability paperwork to [employer] [phone], also called patient to advise.

## 2016-07-11 ENCOUNTER — Other Ambulatory Visit: Payer: Self-pay | Admitting: General Surgery

## 2016-07-11 DIAGNOSIS — Z853 Personal history of malignant neoplasm of breast: Secondary | ICD-10-CM

## 2016-07-21 ENCOUNTER — Ambulatory Visit: Payer: BLUE CROSS/BLUE SHIELD | Attending: General Surgery | Admitting: Physical Therapy

## 2016-07-21 ENCOUNTER — Encounter: Payer: Self-pay | Admitting: Physical Therapy

## 2016-07-21 DIAGNOSIS — M25512 Pain in left shoulder: Secondary | ICD-10-CM | POA: Diagnosis present

## 2016-07-21 DIAGNOSIS — M6281 Muscle weakness (generalized): Secondary | ICD-10-CM | POA: Diagnosis present

## 2016-07-21 DIAGNOSIS — M25612 Stiffness of left shoulder, not elsewhere classified: Secondary | ICD-10-CM | POA: Insufficient documentation

## 2016-07-21 DIAGNOSIS — R293 Abnormal posture: Secondary | ICD-10-CM | POA: Insufficient documentation

## 2016-07-21 NOTE — Therapy (Signed)
[place], [name], [address] Phone: [phone]   Fax:  [phone]  Physical Therapy Evaluation  Patient Details  Name: Maria Allen MRN: [id number] Date of Birth: [birth year] Referring Provider: Barry Dienes  Encounter Date: 07/21/2016      PT End of Session - 07/21/16 1658    Visit Number 1   Number of Visits 9   Date for PT Re-Evaluation 08/18/16   PT Start Time 1603   PT Stop Time 1645   PT Time Calculation (min) 42 min   Activity Tolerance Patient tolerated treatment well   Behavior During Therapy [facility] for tasks assessed/performed      Past Medical History:  Diagnosis Date  . Anxiety   . Breast cancer ([place]) 08/27/15   left  . Breast cancer of upper-inner quadrant of left female breast ([place]) 08/29/2015  . Migraine     Past Surgical History:  Procedure Laterality Date  . BREAST LUMPECTOMY WITH RADIOACTIVE SEED AND SENTINEL LYMPH NODE BIOPSY Left 10/09/2015   Procedure: BREAST LUMPECTOMY WITH RADIOACTIVE SEED AND SENTINEL LYMPH NODE BIOPSY;  Surgeon: Stark Klein, MD;  Location: [facility];  Service: General;  Laterality: Left;  . COLONOSCOPY WITH PROPOFOL N/A 11/13/2014   Procedure: COLONOSCOPY WITH PROPOFOL;  Surgeon: Jerene Bears, MD;  [facility];  Service: Gastroenterology;  Laterality: N/A;  . NO PAST SURGERIES      There were no vitals filed for this visit.       Subjective Assessment - 07/21/16 1612    Subjective I am mainly having trouble with my shoulder at night time. It feels so heavy. It is uncomfortable at certain times. I am not swelling. It is just an ache. I feel pulling when I raise my arm and it feels stiff. It is mainly in the morning when I wake up. It is hard for me to get comfortable. I have trouble reaching behind my back.    Pertinent History Left lumpectomy 10/08/16 Barry Dienes): Invasive lobular carcinoma grade 1, 0.9 cm, ALH, margins negative, 0/3 lymph nodes negative,  T1bN0 stage IA, ER 80%, PR 95%, HER-2 negative ratio 0.97, Ki-67 10%, Oncotype DX 21, 14% ROR, Pt completed radiation and did not require chemotherapy   Patient Stated Goals to loosen up my shoulder, decrease the discomfort   Currently in Pain? No/denies            [facility] PT Assessment - 07/21/16 0001      Assessment   Medical Diagnosis left breast cancer   Referring Provider [place]   Onset Date/Surgical Date 10/09/15   Hand Dominance Right   Prior Therapy none     Precautions   Precautions Other (comment)  at risk of lymphedema     Restrictions   Weight Bearing Restrictions No     Balance Screen   Has the patient fallen in the past 6 months No   Has the patient had a decrease in activity level because of a fear of falling?  No   Is the patient reluctant to leave their home because of a fear of falling?  No     Home Social worker Private residence   Living Arrangements Alone   Available Help at Discharge Friend(s)   Type of [place] to enter   Entrance Stairs-Number of Steps 2   Entrance Stairs-Rails None   Home Layout One level     Prior Function   Level  of Independence Independent   Vocation Full time employment   Solicitor- standing, shampoo   Leisure walking- 4 days/wk, 1 hr      Cognition   Overall Cognitive Status Within Functional Limits for tasks assessed     AROM   Right Shoulder Flexion 158 Degrees   Right Shoulder ABduction 162 Degrees   Right Shoulder Internal Rotation 61 Degrees   Right Shoulder External Rotation 81 Degrees   Left Shoulder Flexion 150 Degrees   Left Shoulder ABduction 141 Degrees   Left Shoulder Internal Rotation 60 Degrees   Left Shoulder External Rotation 90 Degrees           LYMPHEDEMA/ONCOLOGY QUESTIONNAIRE - 07/21/16 1625      Type   Cancer Type left breast cancer     Surgeries   Lumpectomy Date 10/09/15   Sentinel Lymph Node Biopsy Date 10/09/15    Number Lymph Nodes Removed 3     Treatment   Active Chemotherapy Treatment No   Past Chemotherapy Treatment No   Active Radiation Treatment No   Past Radiation Treatment Yes   Date 01/17/16   Body Site left breast   Current Hormone Treatment Yes   Drug Name Arimidex     What other symptoms do you have   Are you Having Heaviness or Tightness Yes   Are you having Pain Yes   Are you having pitting edema No   Is it Hard or Difficult finding clothes that fit No   Do you have infections No   Is there Decreased scar mobility No     Lymphedema Assessments   Lymphedema Assessments Upper extremities     Right Upper Extremity Lymphedema   15 cm Proximal to Olecranon Process 38 cm   Olecranon Process 26.7 cm   15 cm Proximal to Ulnar Styloid Process 27 cm   Just Proximal to Ulnar Styloid Process 15.7 cm   Across Hand at [employer] 19.5 cm   At [place] of 2nd Digit 6.7 cm     Left Upper Extremity Lymphedema   15 cm Proximal to Olecranon Process 38.5 cm   Olecranon Process 26 cm   15 cm Proximal to Ulnar Styloid Process 25.9 cm   Just Proximal to Ulnar Styloid Process 15.6 cm   Across Hand at [employer] 18 cm   At [place] of 2nd Digit 6.4 cm           Quick Dash - 07/21/16 0001    Open a tight or new jar Moderate difficulty   Do heavy household chores (wash walls, wash floors) No difficulty   Carry a shopping bag or briefcase No difficulty   Wash your back Mild difficulty   Use a knife to cut food No difficulty   Recreational activities in which you take some force or impact through your arm, shoulder, or hand (golf, hammering, tennis) Moderate difficulty   During the past week, to what extent has your arm, shoulder or hand problem interfered with your normal social activities with family, friends, neighbors, or groups? Not at all   During the past week, to what extent has your arm, shoulder or hand problem limited your work or other regular daily activities Not at all    Arm, shoulder, or hand pain. Moderate   Tingling (pins and needles) in your arm, shoulder, or hand Moderate   Difficulty Sleeping Severe difficulty   DASH Score 27.27 %  [facility] Adult PT Treatment/Exercise - 07/21/16 0001      Shoulder Exercises: Standing   External Rotation Strengthening;Left;10 reps;Theraband   Theraband Level (Shoulder External Rotation) Level 1 (Yellow)   Internal Rotation Strengthening;Left;10 reps;Theraband   Theraband Level (Shoulder Internal Rotation) Level 1 (Yellow)   Flexion Strengthening;Left;10 reps;Theraband   Theraband Level (Shoulder Flexion) Level 1 (Yellow)   Extension Strengthening;Left;10 reps;Theraband   Theraband Level (Shoulder Extension) Level 1 (Yellow)                        [facility] Term Clinic Goals - 07/21/16 1707      CC Long Term Goal  #1   Title Pt will report a 75% improvement in pain and discomfort in left shoulder to increase comfort.   Time 4   Period Weeks   Status New     CC Long Term Goal  #2   Title Pt will be able to independently verbalize lymphedema risk reduction practices   Time 4   Period Weeks   Status New     CC Long Term Goal  #3   Title Pt will be independent in a home exercise program for strengthening and stretching   Time 4   Period Weeks   Status New     CC Long Term Goal  #4   Title Pt will demonstrate 160 degrees of left shoulder abduction to allow her to reach out to her side   Baseline 141   Time 4   Period Weeks   Status New            Plan - 07/21/16 1659    Clinical Impression Statement Pt presents to PT today for pain in her left shoulder. Pt underwent a lumpectomy for treatment of left breast cancer on 10/09/15. She completed radiation. She returned to work in January 2018 and began having left shoulder pain. 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 She also has pain when reaching behind her back. She is tender around the  insertion of her left rotator cuff muscles. Began instructing pt in left shoulder strengthening exercises today. Educated pt to limit shoulder motion above 90 degrees to prevent further irritation. Pt would benefit from skilled PT services for strengthening and ROM to left shoulder to decrease discomfort. This evaluation was of low complexity due to lack of comorbidities.    Rehab Potential Excellent   Clinical Impairments Affecting Rehab Potential none   PT Frequency 2x / week   PT Duration 4 weeks   PT Treatment/Interventions ADLs/Self Care Home Management;Patient/family education;Passive range of motion;Manual techniques;Therapeutic exercise;Iontophoresis 53m/ml Dexamethasone   PT Next Visit Plan continue with [place] exercises and give as part of HEP if pt has no pain, supine scap series, gentle PROM   PT Home Exercise Plan decrease overhead activities, keep upper arm close to body when shampooing hair    Consulted and Agree with Plan of Care Patient      Patient will benefit from skilled therapeutic intervention in order to improve the following deficits and impairments:  Decreased range of motion, Decreased strength, Impaired UE functional use, Pain  Visit Diagnosis: Acute pain of left shoulder  Stiffness of left shoulder, not elsewhere classified  Muscle weakness (generalized)     Problem List Patient Active Problem List   Diagnosis Date Noted  . Acute bronchitis 05/13/2016  . Breast cancer of upper-inner quadrant of left female breast [place] 08/29/2015  . Routine  general medical examination at a health care facility 08/13/2015  . Special screening for malignant neoplasms, colon   . Morbid obesity ([place]) 08/03/2014    Allyson Sabal [facility] 07/21/2016, 5:11 PM  [place], [name], [address] Phone: [phone]   Fax:  [phone]  Name: Maria Allen MRN: [id number] Date of Birth:  [birth year]  [name], PT 07/21/16 5:13 PM

## 2016-07-23 ENCOUNTER — Ambulatory Visit: Payer: BLUE CROSS/BLUE SHIELD

## 2016-07-23 DIAGNOSIS — M25512 Pain in left shoulder: Secondary | ICD-10-CM

## 2016-07-23 DIAGNOSIS — M6281 Muscle weakness (generalized): Secondary | ICD-10-CM

## 2016-07-23 DIAGNOSIS — M25612 Stiffness of left shoulder, not elsewhere classified: Secondary | ICD-10-CM

## 2016-07-23 DIAGNOSIS — R293 Abnormal posture: Secondary | ICD-10-CM

## 2016-07-23 NOTE — Patient Instructions (Signed)
Strengthening: Resisted Flexion    Cancer Rehab [phone]    Hold tubing with left arm at side. Pull forward and up. Move shoulder through pain-free range of motion. Repeat _5-10___ times per set. Do _1-2___ sessions per day.  Strengthening: Resisted Internal Rotation    Hold tubing in left hand, elbow at side and forearm out. Rotate forearm in across body. Repeat _5-10___ times per set. Do _1-2___ sessions per day.  Strengthening: Resisted Extension    Hold tubing in left hand, arm forward. Pull arm back, elbow straight. Repeat __5-10__ times per set. Do __1-2__ sessions per day.   Strengthening: Resisted External Rotation    Hold tubing in left hand, elbow at side and forearm across body. Rotate forearm out. Repeat _5-10___ times per set. Do __1-2__ sessions per day.    Over Head Pull: Narrow and Wide Grip   Cancer Rehab [phone]   On back, knees bent, feet flat, band across thighs, elbows straight but relaxed. Pull hands apart (start). Keeping elbows straight, bring arms up and over head, hands toward floor. Keep pull steady on band. Hold momentarily. Return slowly, keeping pull steady, back to start. Then do same with a wider grip on the band (past shoulder width) Repeat _5-10__ times. Band color __yellow____   Side Pull: Double Arm   On back, knees bent, feet flat. Arms perpendicular to body, shoulder level, elbows straight but relaxed. Pull arms out to sides, elbows straight. Resistance band comes across collarbones, hands toward floor. Hold momentarily. Slowly return to starting position. Repeat _5-10__ times. Band color _yellow____   Sword   On back, knees bent, feet flat, left hand on left hip, right hand above left. Pull right arm DIAGONALLY (hip to shoulder) across chest. Bring right arm along head toward floor. Hold momentarily. Slowly return to starting position. Repeat _5-10__ times. Do with left arm. Band color _yellow_____   Shoulder Rotation: Double  Arm   On back, knees bent, feet flat, elbows tucked at sides, bent 90, hands palms up. Pull hands apart and down toward floor, keeping elbows near sides. Hold momentarily. Slowly return to starting position. Repeat _5-10__ times. Band color __yellow____

## 2016-07-23 NOTE — Therapy (Signed)
Maria Allen, [name], [address] Phone: [phone]   Fax:  [phone]  Physical Therapy Treatment  Patient Details  Name: Maria Allen MRN: [id number] Date of Birth: [birth year] Referring Provider: Barry Dienes  Encounter Date: 07/23/2016      PT End of Session - 07/23/16 1455    Visit Number 2   Number of Visits 9   Date for PT Re-Evaluation 08/18/16   PT Start Time 5027   PT Stop Time 7412   PT Time Calculation (min) 44 min   Activity Tolerance Patient tolerated treatment well   Behavior During Therapy [facility] for tasks assessed/performed      Past Medical History:  Diagnosis Date  . Anxiety   . Breast cancer ([place]) 08/27/15   left  . Breast cancer of upper-inner quadrant of left female breast ([place]) 08/29/2015  . Migraine     Past Surgical History:  Procedure Laterality Date  . BREAST LUMPECTOMY WITH RADIOACTIVE SEED AND SENTINEL LYMPH NODE BIOPSY Left 10/09/2015   Procedure: BREAST LUMPECTOMY WITH RADIOACTIVE SEED AND SENTINEL LYMPH NODE BIOPSY;  Surgeon: Stark Klein, MD;  Location: [facility];  Service: General;  Laterality: Left;  . COLONOSCOPY WITH PROPOFOL N/A 11/13/2014   Procedure: COLONOSCOPY WITH PROPOFOL;  Surgeon: Jerene Bears, MD;  [facility];  Service: Gastroenterology;  Laterality: N/A;  . NO PAST SURGERIES      There were no vitals filed for this visit.      Subjective Assessment - 07/23/16 1310    Subjective My morning pain is always about a 7/10 but then it goes away unless I reach wrong. Not having any pain right now and I felt good after last session.   Pertinent History Left lumpectomy 10/08/16 Barry Dienes): Invasive lobular carcinoma grade 1, 0.9 cm, ALH, margins negative, 0/3 lymph nodes negative, T1bN0 stage IA, ER 80%, PR 95%, HER-2 negative ratio 0.97, Ki-67 10%, Oncotype DX 21, 14% ROR, Pt completed radiation and did not require chemotherapy   Patient Stated Goals to loosen up  my shoulder, decrease the discomfort   Currently in Pain? No/denies                         [facility] Adult PT Treatment/Exercise - 07/23/16 0001      Shoulder Exercises: Supine   Horizontal ABduction Strengthening;Both;10 reps;Theraband   Theraband Level (Shoulder Horizontal ABduction) Level 1 (Yellow)   External Rotation Strengthening;Both;10 reps;Theraband   Theraband Level (Shoulder External Rotation) Level 1 (Yellow)   Flexion Strengthening;Both;10 reps;Theraband  Narrow and Wide Grip, 10 times each   Theraband Level (Shoulder Flexion) Level 1 (Yellow)   Other Supine Exercises Bil D2 with yellow theraband, 10 times each, pt returned correct demonstration aftre tactile cuing.     Shoulder Exercises: Standing   External Rotation Strengthening;Left;10 reps;Theraband   Theraband Level (Shoulder External Rotation) Level 1 (Yellow)   Internal Rotation Strengthening;Left;10 reps;Theraband   Theraband Level (Shoulder Internal Rotation) Level 1 (Yellow)   Flexion Strengthening;Left;10 reps;Theraband   Theraband Level (Shoulder Flexion) Level 1 (Yellow)   Extension Strengthening;Left;10 reps;Theraband   Theraband Level (Shoulder Extension) Level 1 (Yellow)     Modalities   Modalities Iontophoresis     Iontophoresis   Type of Iontophoresis Dexamethasone   Location Lt lateral shoulder at glenohumeral joint   Dose 40 mA/min   Time 4-6 wear     Manual Therapy   Manual Therapy Passive ROM   Passive ROM  In Supine to Lt lateral shoulder into flexion, abduction, and er to pts tolerance                PT Education - 07/23/16 1454    Education provided Yes   Education Details [place] and supine scapular series with yellow theraband. Instructed pt not to perform these all at once, maybe 1 set (supine) in morning and [place] in afternoon/evening; also to stop if she has any pain.   Person(s) Educated Patient   Methods Explanation;Demonstration;Tactile cues;Handout    Comprehension Verbalized understanding;Returned demonstration;Need further instruction                [facility] Goals - 07/21/16 1707      CC Long Term Goal  #1   Title Pt will report a 75% improvement in pain and discomfort in left shoulder to increase comfort.   Time 4   Period Weeks   Status New     CC Long Term Goal  #2   Title Pt will be able to independently verbalize lymphedema risk reduction practices   Time 4   Period Weeks   Status New     CC Long Term Goal  #3   Title Pt will be independent in a home exercise program for strengthening and stretching   Time 4   Period Weeks   Status New     CC Long Term Goal  #4   Title Pt will demonstrate 160 degrees of left shoulder abduction to allow her to reach out to her side   Baseline 141   Time 4   Period Weeks   Status New            Plan - 07/23/16 1455    Clinical Impression Statement Pt had felt good after performing [place] exercises Monday so had her perform those again and issued those to HEP. Also added supine scapular series and instructed pt to not perform all at once. She did well with these with c/o pain. And began iontophoresis today as cert has been signed by prescribing doctor and pt was instructed in contraindications and when to doff (4-6 hours or sooner if she feels she is having a reaction).  Pt also tolerated P/ROM well and reported feeling good after session today.   Rehab Potential Excellent   Clinical Impairments Affecting Rehab Potential none   PT Frequency 2x / week   PT Duration 4 weeks   PT Treatment/Interventions ADLs/Self Care Home Management;Patient/family education;Passive range of motion;Manual techniques;Therapeutic exercise;Iontophoresis 72m/ml Dexamethasone   PT Next Visit Plan Review HEP's issued today, cont with AA/P/ROM to pts tolerance.   PT Home Exercise Plan decrease overhead activities, keep upper arm close to body when shampooing hair ; [place] and supine  scapular series with yellow theraband   Consulted and Agree with Plan of Care Patient      Patient will benefit from skilled therapeutic intervention in order to improve the following deficits and impairments:  Decreased range of motion, Decreased strength, Impaired UE functional use, Pain  Visit Diagnosis: Acute pain of left shoulder  Stiffness of left shoulder, not elsewhere classified  Muscle weakness (generalized)  Abnormal posture     Problem List Patient Active Problem List   Diagnosis Date Noted  . Acute bronchitis 05/13/2016  . Breast cancer of [place]inner quadrant of [place] female breast ([place] 08/29[place]  . Routine general medical examination at a health care facility 08/13/2015  . Special screening for malignant neoplasms, colon   .  Morbid obesity ([place]) 08/03/2014    Otelia Limes, PTA 07/23/2016, 3:02 PM  [place], [name], [address] Phone: [phone]   Fax:  [phone]  Name: Maria Allen MRN: [id number] Date of Birth: 1961-03-14

## 2016-07-28 ENCOUNTER — Ambulatory Visit: Payer: BLUE CROSS/BLUE SHIELD | Admitting: Physical Therapy

## 2016-07-28 ENCOUNTER — Encounter: Payer: Self-pay | Admitting: Physical Therapy

## 2016-07-28 DIAGNOSIS — M25612 Stiffness of left shoulder, not elsewhere classified: Secondary | ICD-10-CM

## 2016-07-28 DIAGNOSIS — M25512 Pain in left shoulder: Secondary | ICD-10-CM

## 2016-07-28 NOTE — Therapy (Signed)
[place], [name], [address] Phone: [phone]   Fax:  [phone]  Physical Therapy Treatment  Patient Details  Name: Maria Allen MRN: [id number] Date of Birth: 1960/11/21 Referring Provider: Barry Dienes  Encounter Date: 07/28/2016      PT End of Session - 07/28/16 1701    Visit Number 3   Number of Visits 9   Date for PT Re-Evaluation 08/18/16   PT Start Time 5732  pt arrived late   PT Stop Time 1601   PT Time Calculation (min) 27 min   Activity Tolerance Patient tolerated treatment well   Behavior During Therapy [facility] for tasks assessed/performed      Past Medical History:  Diagnosis Date  . Anxiety   . Breast cancer ([place]) 08/27/15   left  . Breast cancer of upper-inner quadrant of left female breast ([place]) 08/29/2015  . Migraine     Past Surgical History:  Procedure Laterality Date  . BREAST LUMPECTOMY WITH RADIOACTIVE SEED AND SENTINEL LYMPH NODE BIOPSY Left 10/09/2015   Procedure: BREAST LUMPECTOMY WITH RADIOACTIVE SEED AND SENTINEL LYMPH NODE BIOPSY;  Surgeon: Stark Klein, MD;  Location: [facility];  Service: General;  Laterality: Left;  . COLONOSCOPY WITH PROPOFOL N/A 11/13/2014   Procedure: COLONOSCOPY WITH PROPOFOL;  Surgeon: Jerene Bears, MD;  [facility];  Service: Gastroenterology;  Laterality: N/A;  . NO PAST SURGERIES      There were no vitals filed for this visit.      Subjective Assessment - 07/28/16 1535    Subjective My pain is doing doing better. The patch did really well. I still feel pain in the morning when I get up. I can really tell a difference.    Pertinent History Left lumpectomy 10/08/16 Barry Dienes): Invasive lobular carcinoma grade 1, 0.9 cm, ALH, margins negative, 0/3 lymph nodes negative, T1bN0 stage IA, ER 80%, PR 95%, HER-2 negative ratio 0.97, Ki-67 10%, Oncotype DX 21, 14% ROR, Pt completed radiation and did not require chemotherapy   Patient Stated Goals to  loosen up my shoulder, decrease the discomfort   Currently in Pain? No/denies                         [facility] Adult PT Treatment/Exercise - 07/28/16 0001      Modalities   Modalities Iontophoresis     Iontophoresis   Type of Iontophoresis Dexamethasone   Location Lt lateral shoulder at glenohumeral joint   Dose 68m Stat patch for 6 hr wear time   Time 4-6 wear     Manual Therapy   Manual Therapy Passive ROM   Passive ROM In Supine to Lt lateral shoulder into flexion, abduction, and er to pts tolerance                        Long Term Clinic Goals - 07/21/16 1707      CC Long Term Goal  #1   Title Pt will report a 75% improvement in pain and discomfort in left shoulder to increase comfort.   Time 4   Period Weeks   Status New     CC Long Term Goal  #2   Title Pt will be able to independently verbalize lymphedema risk reduction practices   Time 4   Period Weeks   Status New     CC Long Term Goal  #3   Title Pt will be independent  in a home exercise program for strengthening and stretching   Time 4   Period Weeks   Status New     CC Long Term Goal  #4   Title Pt will demonstrate 160 degrees of left shoulder abduction to allow her to reach out to her side   Baseline 141   Time 4   Period Weeks   Status New            Plan - 07/28/16 1701    Clinical Impression Statement Pt arrived late to her appointment this afternoon. She wanted to focus on PROM instead of exercises since there was limited time. PROM performed to left shoulder in direction of flexion, abduction and ER with pt tolerating well but stating she was feeling a good stretch. Applied ionto patch at end of session because pt felt that this was beneficial and reports decreased pain.    Rehab Potential Excellent   Clinical Impairments Affecting Rehab Potential none   PT Frequency 2x / week   PT Duration 4 weeks   PT Treatment/Interventions ADLs/Self Care Home  Management;Patient/family education;Passive range of motion;Manual techniques;Therapeutic exercise;Iontophoresis 15m/ml Dexamethasone   PT Next Visit Plan Review HEP', cont with AA/P/ROM to pts tolerance.   PT Home Exercise Plan decrease overhead activities, keep upper arm close to body when shampooing hair ; [place] and supine scapular series with yellow theraband   Consulted and Agree with Plan of Care Patient      Patient will benefit from skilled therapeutic intervention in order to improve the following deficits and impairments:  Decreased range of motion, Decreased strength, Impaired UE functional use, Pain  Visit Diagnosis: Acute pain of left shoulder  Stiffness of left shoulder, not elsewhere classified     Problem List Patient Active Problem List   Diagnosis Date Noted  . Acute bronchitis 05/13/2016  . Breast cancer of upper-inner quadrant of left female breast ([place] 08/29/2015  . Routine general medical examination at a health care facility 08/13/2015  . Special screening for malignant neoplasms, colon   . Morbid obesity ([place] 08/03/2014    Maria SabalBSouthern Indiana Rehabilitation Hospital3/26/2018, 5:04 PM  [place] [address]Phone: [phone]  Fax:  [phone] Name: Maria GAZZOLAMRN: [id number] of Birth: 106/02/1961 BManus Gunning PT 07/28/16 5:04 PM

## 2016-07-29 ENCOUNTER — Ambulatory Visit: Payer: BLUE CROSS/BLUE SHIELD | Admitting: Physical Therapy

## 2016-07-29 DIAGNOSIS — M25512 Pain in left shoulder: Secondary | ICD-10-CM | POA: Diagnosis not present

## 2016-07-29 DIAGNOSIS — M25612 Stiffness of left shoulder, not elsewhere classified: Secondary | ICD-10-CM

## 2016-07-29 DIAGNOSIS — R293 Abnormal posture: Secondary | ICD-10-CM

## 2016-07-29 DIAGNOSIS — M6281 Muscle weakness (generalized): Secondary | ICD-10-CM

## 2016-07-29 NOTE — Therapy (Signed)
[facility] [place], [name], [address] Phone: [phone]   Fax:  [phone]  Physical Therapy Treatment  Patient Details  Name: Maria Allen MRN: [id number] Date of Birth: November 15, 1960 Referring Provider: Barry Dienes  Encounter Date: 07/29/2016      PT End of Session - 07/29/16 1700    Visit Number 4   Number of Visits 9   PT Start Time 0071   PT Stop Time 1646   PT Time Calculation (min) 42 min   Activity Tolerance Patient tolerated treatment well   Behavior During Therapy [facility] for tasks assessed/performed      Past Medical History:  Diagnosis Date  . Anxiety   . Breast cancer ([place]) 08/27/15   left  . Breast cancer of upper-inner quadrant of left female breast ([place]) 08/29/2015  . Migraine     Past Surgical History:  Procedure Laterality Date  . BREAST LUMPECTOMY WITH RADIOACTIVE SEED AND SENTINEL LYMPH NODE BIOPSY Left 10/09/2015   Procedure: BREAST LUMPECTOMY WITH RADIOACTIVE SEED AND SENTINEL LYMPH NODE BIOPSY;  Surgeon: Stark Klein, MD;  Location: [facility];  Service: General;  Laterality: Left;  . COLONOSCOPY WITH PROPOFOL N/A 11/13/2014   Procedure: COLONOSCOPY WITH PROPOFOL;  Surgeon: Jerene Bears, MD;  [facility];  Service: Gastroenterology;  Laterality: N/A;  . NO PAST SURGERIES      There were no vitals filed for this visit.      Subjective Assessment - 07/29/16 1606    Subjective I only have pain when I get up in the morning . Once I get my day started, I don't have any pain    Pertinent History Left lumpectomy 10/08/16 Barry Dienes): Invasive lobular carcinoma grade 1, 0.9 cm, ALH, margins negative, 0/3 lymph nodes negative, T1bN0 stage IA, ER 80%, PR 95%, HER-2 negative ratio 0.97, Ki-67 10%, Oncotype DX 21, 14% ROR, Pt completed radiation and did not require chemotherapy   Patient Stated Goals to loosen up my shoulder, decrease the discomfort   Currently in Pain? No/denies                          [facility] Adult PT Treatment/Exercise - 07/29/16 0001      Self-Care   Self-Care Other Self-Care Comments   Other Self-Care Comments  Reviewed left sidelying position with pillow under body and head to allow room for shoulder to be under her with less compression      Shoulder Exercises: Supine   Horizontal ABduction Strengthening;Both;5 reps;Theraband   Theraband Level (Shoulder Horizontal ABduction) Level 2 (Red)   External Rotation Strengthening;Both;5 reps;Theraband   Theraband Level (Shoulder External Rotation) Level 2 (Red)   Flexion Strengthening;Both;5 reps;Theraband  Narrow and Wide Grip, 5 times each   Theraband Level (Shoulder Flexion) Level 2 (Red)   Other Supine Exercises Bil D2 with red theraband, 10 times each, pt returned correct demonstration aftre tactile cuing.   Other Supine Exercises Meeks decompression exercise      Shoulder Exercises: Sidelying   Other Sidelying Exercises horiziontal abduction with backward thoracic rotation.  pt experienced pain in anterior chest      Shoulder Exercises: Standing   External Rotation --   Theraband Level (Shoulder External Rotation) --   Internal Rotation --   Theraband Level (Shoulder Internal Rotation) --   Flexion --   Theraband Level (Shoulder Flexion) --   Extension --   Theraband Level (Shoulder Extension) --   Row Strengthening;Both;5 reps;Theraband  Theraband Level (Shoulder Row) Level 2 (Red)     Modalities   Modalities Iontophoresis     Iontophoresis   Type of Iontophoresis Dexamethasone   Location Lt lateral shoulder at glenohumeral joint   Dose 40 mA/min   Time 4-6 wear     Manual Therapy   Manual Therapy Myofascial release;Passive ROM   Myofascial Release to left anterior pec major tightness    Passive ROM In Supine to Lt lateral shoulder into flexion, abduction, and er to pts tolerance                        Long Term Clinic Goals - 07/21/16  1707      CC Long Term Goal  #1   Title Pt will report a 75% improvement in pain and discomfort in left shoulder to increase comfort.   Time 4   Period Weeks   Status New     CC Long Term Goal  #2   Title Pt will be able to independently verbalize lymphedema risk reduction practices   Time 4   Period Weeks   Status New     CC Long Term Goal  #3   Title Pt will be independent in a home exercise program for strengthening and stretching   Time 4   Period Weeks   Status New     CC Long Term Goal  #4   Title Pt will demonstrate 160 degrees of left shoulder abduction to allow her to reach out to her side   Baseline 141   Time 4   Period Weeks   Status New            Plan - 07/29/16 1701    Clinical Impression Statement Upgraded exercise to red theraband and added rows .  Also reviewed supported sleeping position and  instructed to do Meek's decompression prior to getting out of bed in the morning to see if it will decreased her early am symptoms.  Pt deferred ionto today but did identily tender tight areas in anterior pec muscle likley from radiation    Rehab Potential Excellent   Clinical Impairments Affecting Rehab Potential none   PT Frequency 2x / week   PT Duration 4 weeks   PT Treatment/Interventions ADLs/Self Care Home Management;Patient/family education;Passive range of motion;Manual techniques;Therapeutic exercise;Iontophoresis 55m/ml Dexamethasone   PT Next Visit Plan Review HEP', cont with AA/P/ROM to pts tolerance.      Patient will benefit from skilled therapeutic intervention in order to improve the following deficits and impairments:  Decreased range of motion, Decreased strength, Impaired UE functional use, Pain  Visit Diagnosis: Acute pain of left shoulder  Stiffness of left shoulder, not elsewhere classified  Muscle weakness (generalized)  Abnormal posture     Problem List Patient Active Problem List   Diagnosis Date Noted  . Acute bronchitis  05/13/2016  . Breast cancer of upper-inner quadrant of left female breast ([place] 08/29/2015  . Routine general medical examination at a health care facility 08/13/2015  . Special screening for malignant neoplasms, colon   . Morbid obesity ([place] 08/03/2014   TDonato Heinz BOwens SharkPT  BNorwood Levo3/27/2018, 5:05 PM  [place] [address]Phone: [phone]  Fax:  [phone] Name: EJULIAH SCADDENMRN: [id number] of Birth: 1Apr 29, 1962

## 2016-07-29 NOTE — Patient Instructions (Signed)

## 2016-08-04 ENCOUNTER — Encounter: Payer: Self-pay | Admitting: Physical Therapy

## 2016-08-04 ENCOUNTER — Ambulatory Visit: Payer: BLUE CROSS/BLUE SHIELD | Attending: General Surgery | Admitting: Physical Therapy

## 2016-08-04 DIAGNOSIS — M25612 Stiffness of left shoulder, not elsewhere classified: Secondary | ICD-10-CM | POA: Insufficient documentation

## 2016-08-04 DIAGNOSIS — R293 Abnormal posture: Secondary | ICD-10-CM | POA: Diagnosis present

## 2016-08-04 DIAGNOSIS — M25512 Pain in left shoulder: Secondary | ICD-10-CM | POA: Insufficient documentation

## 2016-08-04 DIAGNOSIS — M6281 Muscle weakness (generalized): Secondary | ICD-10-CM

## 2016-08-04 NOTE — Therapy (Signed)
Maria Allen, [name], [address] Phone: [phone]   Fax:  [phone]  Physical Therapy Treatment  Patient Details  Name: Maria Allen MRN: [id number] Date of Birth: [birth year] Referring Provider: Barry Dienes  Encounter Date: 08/04/2016      PT End of Session - 08/04/16 1655    Visit Number 5   Number of Visits 9   Date for PT Re-Evaluation 08/18/16   PT Start Time 1604   PT Stop Time 1651   PT Time Calculation (min) 47 min   Activity Tolerance Patient tolerated treatment well   Behavior During Therapy [facility] for tasks assessed/performed      Past Medical History:  Diagnosis Date  . Anxiety   . Breast cancer ([place]) 08/27/15   left  . Breast cancer of upper-inner quadrant of left female breast ([place]) 08/29/2015  . Migraine     Past Surgical History:  Procedure Laterality Date  . BREAST LUMPECTOMY WITH RADIOACTIVE SEED AND SENTINEL LYMPH NODE BIOPSY Left 10/09/2015   Procedure: BREAST LUMPECTOMY WITH RADIOACTIVE SEED AND SENTINEL LYMPH NODE BIOPSY;  Surgeon: Stark Klein, MD;  Location: [facility];  Service: General;  Laterality: Left;  . COLONOSCOPY WITH PROPOFOL N/A 11/13/2014   Procedure: COLONOSCOPY WITH PROPOFOL;  Surgeon: Jerene Bears, MD;  [facility];  Service: Gastroenterology;  Laterality: N/A;  . NO PAST SURGERIES      There were no vitals filed for this visit.      Subjective Assessment - 08/04/16 1607    Subjective The way [name] told me to sleep worked! It feels so much better.    Pertinent History Left lumpectomy 10/08/16 Barry Dienes): Invasive lobular carcinoma grade 1, 0.9 cm, ALH, margins negative, 0/3 lymph nodes negative, T1bN0 stage IA, ER 80%, PR 95%, HER-2 negative ratio 0.97, Ki-67 10%, Oncotype DX 21, 14% ROR, Pt completed radiation and did not require chemotherapy   Patient Stated Goals to loosen up my shoulder, decrease the discomfort   Currently in Pain? No/denies                          [facility] Adult PT Treatment/Exercise - 08/04/16 0001      Shoulder Exercises: Supine   Horizontal ABduction Strengthening;Both;10 reps;Theraband   Theraband Level (Shoulder Horizontal ABduction) Level 2 (Red)   External Rotation Strengthening;Both;10 reps;Theraband   Theraband Level (Shoulder External Rotation) Level 2 (Red)   Flexion Strengthening;Both;10 reps;Theraband  Narrow and Wide Grip, 10 times each   Theraband Level (Shoulder Flexion) Level 2 (Red)   Other Supine Exercises Bil D2 with red theraband, 10 times each, pt returned correct demonstration aftre tactile cuing.   Other Supine Exercises Meeks decompression exercise, pec stretch over foam roll with arms outstretch x 3 min     Shoulder Exercises: Standing   External Rotation Strengthening;Left;10 reps;Theraband   Theraband Level (Shoulder External Rotation) Level 2 (Red)   Internal Rotation Strengthening;Left;10 reps;Theraband   Theraband Level (Shoulder Internal Rotation) Level 2 (Red)   Flexion Strengthening;Left;10 reps;Theraband   Theraband Level (Shoulder Flexion) Level 2 (Red)   Extension Strengthening;Left;10 reps;Theraband   Theraband Level (Shoulder Extension) Level 2 (Red)     Modalities   Modalities --     Iontophoresis   Type of Iontophoresis --   Location --   Dose --   Time --     Manual Therapy   Manual Therapy Passive ROM;Soft tissue mobilization   Soft tissue mobilization  to left pec, upper traps and levator scapula   Passive ROM In Supine to Lt lateral shoulder into flexion, abduction, and er to pts tolerance                        Long Term Clinic Goals - 07/21/16 1707      CC Long Term Goal  #1   Title Pt will report a 75% improvement in pain and discomfort in left shoulder to increase comfort.   Time 4   Period Weeks   Status New     CC Long Term Goal  #2   Title Pt will be able to independently verbalize lymphedema risk reduction  practices   Time 4   Period Weeks   Status New     CC Long Term Goal  #3   Title Pt will be independent in a home exercise program for strengthening and stretching   Time 4   Period Weeks   Status New     CC Long Term Goal  #4   Title Pt will demonstrate 160 degrees of left shoulder abduction to allow her to reach out to her side   Baseline 141   Time 4   Period Weeks   Status New            Plan - 08/04/16 1655    Clinical Impression Statement Pt stated her need sleeping position was very helpful and she is sleeping much better and not waking up with pain. Reviewed Meeks decompression exercises today. Pt demonstrating increased independence with supine scap exercises using red band and may be able to progress to green at next appointment. She is having some tenderness at pec along clavicle. Did soft tissue mobilization in this area as well as stretching over foam roll.    Rehab Potential Excellent   Clinical Impairments Affecting Rehab Potential none   PT Frequency 2x / week   PT Duration 4 weeks   PT Treatment/Interventions ADLs/Self Care Home Management;Patient/family education;Passive range of motion;Manual techniques;Therapeutic exercise;Iontophoresis 76m/ml Dexamethasone   PT Next Visit Plan progress to green theraband?, cont with soft tissue moblization to pec along clavicle,', cont with AA/P/ROM to pts tolerance.   PT Home Exercise Plan decrease overhead activities, keep upper arm close to body when shampooing hair ; [place] and supine scapular series with red theraband   Consulted and Agree with Plan of Care Patient      Patient will benefit from skilled therapeutic intervention in order to improve the following deficits and impairments:  Decreased range of motion, Decreased strength, Impaired UE functional use, Pain  Visit Diagnosis: Acute pain of left shoulder  Stiffness of left shoulder, not elsewhere classified  Muscle weakness (generalized)     Problem  List Patient Active Problem List   Diagnosis Date Noted  . Acute bronchitis 05/13/2016  . Breast cancer of upper-inner quadrant of left female breast ([place] 08/29/2015  . Routine general medical examination at a health care facility 08/13/2015  . Special screening for malignant neoplasms, colon   . Morbid obesity ([place] 08/03/2014    BAllyson SabalBEye [facility]4/06/2016, 5[place] [address]Phone: [phone]  Fax:  [phone] Name: Maria FIFEMRN: [id number] of Birth: 110/14/1962 BManus Gunning PT 08/04/16 5:02 PM

## 2016-08-05 ENCOUNTER — Ambulatory Visit: Payer: BLUE CROSS/BLUE SHIELD | Admitting: Physical Therapy

## 2016-08-05 DIAGNOSIS — R293 Abnormal posture: Secondary | ICD-10-CM

## 2016-08-05 DIAGNOSIS — M6281 Muscle weakness (generalized): Secondary | ICD-10-CM

## 2016-08-05 DIAGNOSIS — M25512 Pain in left shoulder: Secondary | ICD-10-CM

## 2016-08-05 DIAGNOSIS — M25612 Stiffness of left shoulder, not elsewhere classified: Secondary | ICD-10-CM

## 2016-08-05 NOTE — Therapy (Signed)
[place], [name], [address] Phone: [phone]   Fax:  [phone]  Physical Therapy Treatment  Patient Details  Name: Maria Allen MRN: [id number] Date of Birth: [birth year] Referring Provider: Barry Dienes  Encounter Date: 08/05/2016      PT End of Session - 08/05/16 1804    Visit Number 6   Number of Visits 9   Date for PT Re-Evaluation 08/18/16   PT Start Time 1324   PT Stop Time 1645   PT Time Calculation (min) 39 min   Activity Tolerance Patient tolerated treatment well   Behavior During Therapy [facility] for tasks assessed/performed      Past Medical History:  Diagnosis Date  . Anxiety   . Breast cancer ([place]) 08/27/15   left  . 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 Migraine     Past Surgical History:  Procedure Laterality Date  . BREAST LUMPECTOMY WITH RADIOACTIVE SEED AND SENTINEL LYMPH NODE BIOPSY Left 10/09/2015   Procedure: BREAST LUMPECTOMY WITH RADIOACTIVE SEED AND SENTINEL LYMPH NODE BIOPSY;  Surgeon: Stark Klein, MD;  Location: [facility];  Service: General;  Laterality: Left;  . COLONOSCOPY WITH PROPOFOL N/A 11/13/2014   Procedure: COLONOSCOPY WITH PROPOFOL;  Surgeon: Jerene Bears, MD;  [facility];  Service: Gastroenterology;  Laterality: N/A;  . NO PAST SURGERIES      There were no vitals filed for this visit.      Subjective Assessment - 08/05/16 1609    Subjective pt feels tired,  She worked alot over the weekend with arms elevated and she did okay. She has some pain with deep palpation at area near insertion of deltoid on left arm    Pertinent History Left lumpectomy 10/09/15 Barry Dienes): Invasive lobular carcinoma grade 1, 0.9 cm, ALH, margins negative, 0/3 lymph nodes negative, T1bN0 stage IA, ER 80%, PR 95%, HER-2 negative ratio 0.97, Ki-67 10%, Oncotype DX 21, 14% ROR, Pt completed radiation and did not require chemotherapy   Patient  Stated Goals to loosen up my shoulder, decrease the discomfort   Currently in Pain? No/denies  only with deep pressure to left arm                          [facility] Adult PT Treatment/Exercise - 08/05/16 0001      Shoulder Exercises: Sidelying   ABduction AROM;Left;5 reps   Other Sidelying Exercises horiziontal abduction with backward thoracic rotation.  pt experienced pain in anterior chest    Other Sidelying Exercises small circles with arm pointed straight ahead      Shoulder Exercises: Standing   External Rotation Strengthening;Both;5 reps;Theraband   Theraband Level (Shoulder External Rotation) Level 3 (Green)   Extension Strengthening;Right;Left;Both;5 reps;Theraband   Theraband Level (Shoulder Extension) Level 3 (Green)   Row Strengthening;Both;5 reps;Theraband   Theraband Level (Shoulder Row) Level 3 (Green)     Shoulder Exercises: Isometric Strengthening   Flexion 3X3"   Extension 3X5"   ABduction 3X3"     Modalities   Modalities Iontophoresis     Iontophoresis   Location Lt lateral shoulder at glenohumeral joint   Dose 40 mA/min   Time 4-6 wear     Manual Therapy   Manual Therapy Passive ROM;Soft tissue mobilization   Soft tissue mobilization to left pec, upper traps and levator scapula   Passive ROM In Supine to Lt lateral shoulder into flexion, abduction, and er  to pts tolerance                        Long Term Clinic Goals - 08/05/16 1809      CC Long Term Goal  #1   Title Pt will report a 75% improvement in pain and discomfort in left shoulder to increase comfort.   Time 4   Period Weeks   Status On-going     CC Long Term Goal  #2   Title Pt will be able to independently verbalize lymphedema risk reduction practices   Time 4   Period Weeks   Status On-going     CC Long Term Goal  #3   Title Pt will be independent in a home exercise program for strengthening and stretching   Time 4   Period Weeks   Status On-going      CC Long Term Goal  #4   Title Pt will demonstrate 160 degrees of left shoulder abduction to allow her to reach out to her side   Baseline 141   Status On-going            Plan - 08/05/16 1804    Clinical Impression Statement pt continues to have pain in subacromial area of left shoudler with palpation with posutural influence.  treatment today focused on soft tissue to work to this area with stretching to pec minor in supine and throacic spine in sidelying stretch as well as strengthening to scapular area. She received into patch today to increase inflammation.    Rehab Potential Excellent   Clinical Impairments Affecting Rehab Potential none   PT Frequency 2x / week   PT Duration 4 weeks   PT Treatment/Interventions ADLs/Self Care Home Management;Patient/family education;Passive range of motion;Manual techniques;Therapeutic exercise;Iontophoresis 91m/ml Dexamethasone   PT Next Visit Plan continue with scapular strengthening and shoulder strengthening .  cont with soft tissue moblization to pec along clavicle,', cont with AA/P/ROM to pts tolerance.  Into as needed for pain relief    Consulted and Agree with Plan of Care Patient      Patient will benefit from skilled therapeutic intervention in order to improve the following deficits and impairments:  Decreased range of motion, Decreased strength, Impaired UE functional use, Pain  Visit Diagnosis: Acute pain of left shoulder  Stiffness of left shoulder, not elsewhere classified  Muscle weakness (generalized)  Abnormal posture     Problem List Patient Active Problem List   Diagnosis Date Noted  . Acute bronchitis 05/13/2016  . Breast cancer of upper-inner quadrant of left female breast ([place] 08/29/2015  . Routine general medical examination at a health care facility 08/13/2015  . Special screening for malignant neoplasms, colon   . Morbid obesity ([place] 08/03/2014   TDonato Heinz BOwens SharkPT  BNorwood Levo4/07/2016, [place] [address]Phone: [phone]  Fax:  [phone] Name: Maria HARJOMRN: [id number] of Birth: 110-09-1960

## 2016-08-11 ENCOUNTER — Encounter: Payer: BLUE CROSS/BLUE SHIELD | Admitting: Physical Therapy

## 2016-08-12 ENCOUNTER — Encounter: Payer: BLUE CROSS/BLUE SHIELD | Admitting: Physical Therapy

## 2016-08-13 ENCOUNTER — Ambulatory Visit: Payer: BLUE CROSS/BLUE SHIELD

## 2016-08-13 DIAGNOSIS — M25512 Pain in left shoulder: Secondary | ICD-10-CM

## 2016-08-13 DIAGNOSIS — M25612 Stiffness of left shoulder, not elsewhere classified: Secondary | ICD-10-CM

## 2016-08-13 DIAGNOSIS — M6281 Muscle weakness (generalized): Secondary | ICD-10-CM

## 2016-08-13 DIAGNOSIS — R293 Abnormal posture: Secondary | ICD-10-CM

## 2016-08-13 NOTE — Therapy (Signed)
[place], [name], [address] Phone: [phone]   Fax:  [phone]  Physical Therapy Treatment  Patient Details  Name: Maria Allen MRN: [id number] Date of Birth: 1961-01-14 Referring Provider: Barry Dienes  Encounter Date: 08/13/2016      PT End of Session - 08/13/16 1347    Visit Number 7   Number of Visits 9   Date for PT Re-Evaluation 08/18/16   PT Start Time 9390   PT Stop Time 1346   PT Time Calculation (min) 41 min   Activity Tolerance Patient tolerated treatment well   Behavior During Therapy [facility] for tasks assessed/performed      Past Medical History:  Diagnosis Date  . Anxiety   . Breast cancer ([place]) 08/27/15   left  . Breast cancer of upper-inner quadrant of left female breast ([place]) 08/29/2015  . Migraine     Past Surgical History:  Procedure Laterality Date  . BREAST LUMPECTOMY WITH RADIOACTIVE SEED AND SENTINEL LYMPH NODE BIOPSY Left 10/09/2015   Procedure: BREAST LUMPECTOMY WITH RADIOACTIVE SEED AND SENTINEL LYMPH NODE BIOPSY;  Surgeon: Stark Klein, MD;  Location: [facility];  Service: General;  Laterality: Left;  . COLONOSCOPY WITH PROPOFOL N/A 11/13/2014   Procedure: COLONOSCOPY WITH PROPOFOL;  Surgeon: Jerene Bears, MD;  [facility];  Service: Gastroenterology;  Laterality: N/A;  . NO PAST SURGERIES      There were no vitals filed for this visit.      Subjective Assessment - 08/13/16 1306    Subjective The patch has been helping my pain and that's going well. Feeling good today, not hurting at all.    Pertinent History Left lumpectomy 10/09/15 Barry Dienes): Invasive lobular carcinoma grade 1, 0.9 cm, ALH, margins negative, 0/3 lymph nodes negative, T1bN0 stage IA, ER 80%, PR 95%, HER-2 negative ratio 0.97, Ki-67 10%, Oncotype DX 21, 14% ROR, Pt completed radiation and did not require chemotherapy   Patient Stated Goals to loosen up my shoulder, decrease the discomfort   Currently  in Pain? No/denies            [facility] PT Assessment - 08/13/16 0001      AROM   Left Shoulder ABduction 168 Degrees                     [facility] Adult PT Treatment/Exercise - 08/13/16 0001      Shoulder Exercises: Standing   External Rotation Strengthening;Both;10 reps;Theraband   Extension Strengthening;Left;10 reps;Theraband   Theraband Level (Shoulder Extension) Level 3 (Green)   Row Strengthening;Both;10 reps;Theraband   Theraband Level (Shoulder Row) Level 3 (Green)     Shoulder Exercises: Isometric Strengthening   Flexion 5X5"  Lt UE   Extension 5X5"  Lt UE   ABduction 5X5"  Lt UE     Iontophoresis   Type of Iontophoresis Dexamethasone   Location Lt lateral shoulder at glenohumeral joint   Dose 40 mA/min   Time 4-6 wear     Manual Therapy   Manual Therapy Passive ROM;Soft tissue mobilization   Soft tissue mobilization to left pec, upper traps and levator scapula   Passive ROM In Supine to Lt lateral shoulder into flexion, abduction, and er to pts tolerance                        Long Term Clinic Goals - 08/13/16 1313      CC Long Term Goal  #1  Title Pt will report a 75% improvement in pain and discomfort in left shoulder to increase comfort.   Baseline 70% improvement at this time-08/13/16   Status Partially Met     CC Long Term Goal  #2   Title Pt will be able to independently verbalize lymphedema risk reduction practices   Status On-going     CC Long Term Goal  #3   Title Pt will be independent in a home exercise program for strengthening and stretching   Status Partially Met     CC Long Term Goal  #4   Title Pt will demonstrate 160 degrees of left shoulder abduction to allow her to reach out to her side   Baseline 141; 168 degrees-08/13/16   Status Achieved            Plan - 08/13/16 1347    Clinical Impression Statement Pt reports making good progress, overall 70% improved since start of care and reports the  iontophoresis patches have been elping alot along with stretching and strengthening exercises. Her A/ROM in abduction has improve as well meeting that goal.    Rehab Potential Excellent   Clinical Impairments Affecting Rehab Potential none   PT Frequency 2x / week   PT Duration 4 weeks   PT Treatment/Interventions ADLs/Self Care Home Management;Patient/family education;Passive range of motion;Manual techniques;Therapeutic exercise;Iontophoresis 28m/ml Dexamethasone   PT Next Visit Plan continue with scapular strengthening and shoulder strengthening .  cont with soft tissue moblization to pec along clavicle,', cont with AA/P/ROM to pts tolerance.  Ionto as needed for pain relief 1 more time.    Consulted and Agree with Plan of Care Patient      Patient will benefit from skilled therapeutic intervention in order to improve the following deficits and impairments:  Decreased range of motion, Decreased strength, Impaired UE functional use, Pain  Visit Diagnosis: Acute pain of left shoulder  Stiffness of left shoulder, not elsewhere classified  Muscle weakness (generalized)  Abnormal posture     Problem List Patient Active Problem List   Diagnosis Date Noted  . Acute bronchitis 05/13/2016  . Breast cancer of upper-inner quadrant of left female breast ([place] 08/29/2015  . Routine general medical examination at a health care facility 08/13/2015  . Special screening for malignant neoplasms, colon   . Morbid obesity ([place] 08/03/2014    ROtelia Limes PTA 08/13/2016, 1:50 PM  [place] [address]Phone: [phone]  Fax:  [phone] Name: Maria DAURIAMRN: [id number] of Birth: 107-04-1961

## 2016-08-14 ENCOUNTER — Encounter: Payer: Self-pay | Admitting: Physical Therapy

## 2016-08-14 ENCOUNTER — Ambulatory Visit: Payer: BLUE CROSS/BLUE SHIELD | Admitting: Physical Therapy

## 2016-08-14 DIAGNOSIS — R293 Abnormal posture: Secondary | ICD-10-CM

## 2016-08-14 DIAGNOSIS — M6281 Muscle weakness (generalized): Secondary | ICD-10-CM

## 2016-08-14 DIAGNOSIS — M25512 Pain in left shoulder: Secondary | ICD-10-CM | POA: Diagnosis not present

## 2016-08-14 DIAGNOSIS — M25612 Stiffness of left shoulder, not elsewhere classified: Secondary | ICD-10-CM

## 2016-08-14 NOTE — Therapy (Signed)
[place], [name], [address] Phone: [phone]   Fax:  [phone]  Physical Therapy Treatment  Patient Details  Name: Maria Allen MRN: [id number] Date of Birth: 1960-06-25 Referring Provider: Barry Dienes  Encounter Date: 08/14/2016      PT End of Session - 08/14/16 0846    Visit Number 8   Number of Visits 9   Date for PT Re-Evaluation 08/18/16   PT Start Time 0804   PT Stop Time 0844   PT Time Calculation (min) 40 min   Activity Tolerance Patient tolerated treatment well   Behavior During Therapy [facility] for tasks assessed/performed      Past Medical History:  Diagnosis Date  . Anxiety   . Breast cancer ([place]) 08/27/15   left  . Breast cancer of upper-inner quadrant of left female breast ([place]) 08/29/2015  . Migraine     Past Surgical History:  Procedure Laterality Date  . BREAST LUMPECTOMY WITH RADIOACTIVE SEED AND SENTINEL LYMPH NODE BIOPSY Left 10/09/2015   Procedure: BREAST LUMPECTOMY WITH RADIOACTIVE SEED AND SENTINEL LYMPH NODE BIOPSY;  Surgeon: Stark Klein, MD;  Location: [place];  Service: General;  Laterality: Left;  . COLONOSCOPY WITH PROPOFOL N/A 11/13/2014   Procedure: COLONOSCOPY WITH PROPOFOL;  Surgeon: Jerene Bears, MD;  Location: [facility];  Service: Gastroenterology;  Laterality: N/A;  . NO PAST SURGERIES      There were no vitals filed for this visit.      Subjective Assessment - 08/14/16 0806    Subjective This morning when I got up I felt a pop in my armpit. I was wondering if something went back in place. My range of motion is really good.    Pertinent History Left lumpectomy 10/09/15 Barry Dienes): Invasive lobular carcinoma grade 1, 0.9 cm, ALH, margins negative, 0/3 lymph nodes negative, T1bN0 stage IA, ER 80%, PR 95%, HER-2 negative ratio 0.97, Ki-67 10%, Oncotype DX 21, 14% ROR, Pt completed radiation and did not require chemotherapy   Patient Stated Goals to loosen up my  shoulder, decrease the discomfort   Currently in Pain? No/denies                         [facility] Adult PT Treatment/Exercise - 08/14/16 0001      Shoulder Exercises: Standing   External Rotation Strengthening;Both;10 reps;Theraband   Theraband Level (Shoulder External Rotation) Level 3 (Green)   Extension Strengthening;Left;10 reps;Theraband   Theraband Level (Shoulder Extension) Level 3 (Green)   Row Strengthening;Both;10 reps;Theraband   Theraband Level (Shoulder Row) Level 3 (Green)     Shoulder Exercises: Isometric Strengthening   Flexion 5X5"  Lt UE   Extension 5X5"  Lt UE   ABduction 5X5"  Lt UE and R UE     Manual Therapy   Manual Therapy Passive ROM;Soft tissue mobilization   Soft tissue mobilization to left pec, upper traps and levator scapula   Passive ROM In Supine to Lt lateral shoulder into flexion, abduction, and er to pts tolerance                        Long Term Clinic Goals - 08/13/16 1313      CC Long Term Goal  #1   Title Pt will report a 75% improvement in pain and discomfort in left shoulder to increase comfort.   Baseline 70% improvement at this time-08/13/16   Status Partially Met  CC Long Term Goal  #2   Title Pt will be able to independently verbalize lymphedema risk reduction practices   Status On-going     CC Long Term Goal  #3   Title Pt will be independent in a home exercise program for strengthening and stretching   Status Partially Met     CC Long Term Goal  #4   Title Pt will demonstrate 160 degrees of left shoulder abduction to allow her to reach out to her side   Baseline 141; 168 degrees-08/13/16   Status Achieved            Plan - 08/14/16 0846    Clinical Impression Statement Patient continues to progress. She demonstrates further improvement with range of motion per visual estimate and at the end of the session she was able to reach up and behind her back to grab her jacket which she had  been unable to do. Encouraged pt to perform isometrics bilaterally since she could feel isometric abduction strongly bilaterally. Decided to hold off on the ionto patch over the weekend to see if pt is able to manage tenderness through massage to the area.    Rehab Potential Excellent   Clinical Impairments Affecting Rehab Potential none   PT Frequency 2x / week   PT Duration 4 weeks   PT Treatment/Interventions ADLs/Self Care Home Management;Patient/family education;Passive range of motion;Manual techniques;Therapeutic exercise;Iontophoresis 33m/ml Dexamethasone   PT Next Visit Plan continue with scapular strengthening and shoulder strengthening .  cont with soft tissue moblization to pec along clavicle,', cont with AA/P/ROM to pts tolerance.  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 Acute bronchitis 05/13/2016  . Breast cancer of upper-inner quadrant of left female breast ([place] 08/29/2015  . Routine general medical examination at a health care facility 08/13/2015  . Special screening for malignant neoplasms, colon   . Morbid obesity ([place] 08/03/2014    BAllyson SabalBBakersfield [facility]4/04/2017, 8:49 AM  [place] [address]Phone: [phone]  Fax:   [phone] Name: Maria KARRERMRN: [id number] of Birth: 1Sep 26, 1962 BManus Gunning PT 08/14/16 8:49 AM

## 2016-08-18 ENCOUNTER — Other Ambulatory Visit: Payer: Self-pay | Admitting: Obstetrics & Gynecology

## 2016-08-18 ENCOUNTER — Ambulatory Visit
Admission: RE | Admit: 2016-08-18 | Discharge: 2016-08-18 | Disposition: A | Discharge status: Home / Self Care | Payer: BLUE CROSS/BLUE SHIELD | Source: Ambulatory Visit | Attending: General Surgery | Admitting: General Surgery

## 2016-08-18 ENCOUNTER — Ambulatory Visit: Payer: BLUE CROSS/BLUE SHIELD

## 2016-08-18 DIAGNOSIS — M6281 Muscle weakness (generalized): Secondary | ICD-10-CM

## 2016-08-18 DIAGNOSIS — M25512 Pain in left shoulder: Secondary | ICD-10-CM

## 2016-08-18 DIAGNOSIS — M25612 Stiffness of left shoulder, not elsewhere classified: Secondary | ICD-10-CM

## 2016-08-18 DIAGNOSIS — R293 Abnormal posture: Secondary | ICD-10-CM

## 2016-08-18 DIAGNOSIS — Z853 Personal history of malignant neoplasm of breast: Secondary | ICD-10-CM

## 2016-08-18 HISTORY — DX: Personal history of irradiation: Z92.3

## 2016-08-18 NOTE — Therapy (Signed)
[place], [name], [address] Phone: [phone]   Fax:  [phone]  Physical Therapy Treatment  Patient Details  Name: Maria Allen MRN: [id number] Date of Birth: 21-May-1960 Referring Provider: Barry Dienes  Encounter Date: 08/18/2016      PT End of Session - 08/18/16 0850    Visit Number 9   Number of Visits 9   Date for PT Re-Evaluation 08/18/16   PT Start Time 0805   PT Stop Time 0850   PT Time Calculation (min) 45 min   Activity Tolerance Patient tolerated treatment well   Behavior During Therapy [facility] for tasks assessed/performed      Past Medical History:  Diagnosis Date  . Anxiety   . Breast cancer ([place]) 08/27/15   left  . 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 Migraine     Past Surgical History:  Procedure Laterality Date  . BREAST LUMPECTOMY WITH RADIOACTIVE SEED AND SENTINEL LYMPH NODE BIOPSY Left 10/09/2015   Procedure: BREAST LUMPECTOMY WITH RADIOACTIVE SEED AND SENTINEL LYMPH NODE BIOPSY;  Surgeon: Stark Klein, MD;  Location: [facility];  Service: General;  Laterality: Left;  . COLONOSCOPY WITH PROPOFOL N/A 11/13/2014   Procedure: COLONOSCOPY WITH PROPOFOL;  Surgeon: Jerene Bears, MD;  [facility];  Service: Gastroenterology;  Laterality: N/A;  . NO PAST SURGERIES      There were no vitals filed for this visit.      Subjective Assessment - 08/18/16 0811    Subjective My ROM is still doing good from last week. 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 I'll be ready to D/C next visit.    Pertinent History Left lumpectomy 10/09/15 Barry Dienes): Invasive lobular carcinoma grade 1, 0.9 cm, ALH, margins negative, 0/3 lymph nodes negative, T1bN0 stage IA, ER 80%, PR 95%, HER-2 negative ratio 0.97, Ki-67 10%, Oncotype DX 21, 14% ROR, Pt completed radiation and did not require chemotherapy   Patient Stated Goals  to loosen up my shoulder, decrease the discomfort   Currently in Pain? No/denies                         [facility] Adult PT Treatment/Exercise - 08/18/16 0001      Shoulder Exercises: Standing   Horizontal ABduction Strengthening;Both;10 reps;Theraband   Theraband Level (Shoulder Horizontal ABduction) Level 2 (Red)   Horizontal ABduction Limitations In front of mirror for correct UE position/technique   External Rotation Strengthening;Both;20 reps;Theraband   Theraband Level (Shoulder External Rotation) Level 3 (Green)   Extension Strengthening;Left;10 reps;Theraband   Theraband Level (Shoulder Extension) Level 3 (Green)   Extension Limitations Tactile cuing for correct UE technique   Row Strengthening;Both;10 reps;Theraband   Theraband Level (Shoulder Row) Level 3 (Green)   Other Standing Exercises Wall Push Ups 10 times, pt returning correct demonstration.     Shoulder Exercises: Therapy Ball   Flexion 10 reps  With forward lean into end of stretch   ABduction 10 reps  Lt UE with side lean into end of stretch     Manual Therapy   Manual Therapy Passive ROM;Soft tissue mobilization   Soft tissue mobilization to left pec, upper traps and levator scapula   Passive ROM In Supine to Lt lateral shoulder into flexion, abduction, D2 and er to pts tolerance  Long Term Clinic Goals - 08/13/16 1313      CC Long Term Goal  #1   Title Pt will report a 75% improvement in pain and discomfort in left shoulder to increase comfort.   Baseline 70% improvement at this time-08/13/16   Status Partially Met     CC Long Term Goal  #2   Title Pt will be able to independently verbalize lymphedema risk reduction practices   Status On-going     CC Long Term Goal  #3   Title Pt will be independent in a home exercise program for strengthening and stretching   Status Partially Met     CC Long Term Goal  #4   Title Pt will demonstrate 160 degrees of  left shoulder abduction to allow her to reach out to her side   Baseline 141; 168 degrees-08/13/16   Status Achieved            Plan - 08/18/16 0853    Clinical Impression Statement Pt is progressing very well towards remaining goals and reports will be ready for D/C at next visit. 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    Rehab Potential Excellent   Clinical Impairments Affecting Rehab Potential none   PT Frequency 2x / week   PT Duration 4 weeks   PT Treatment/Interventions ADLs/Self Care Home Management;Patient/family education;Passive range of motion;Manual techniques;Therapeutic exercise;Iontophoresis 50m/ml Dexamethasone   PT Next Visit Plan D/C next visit. Review HEP prn, possibly add 3 way raises?? Remeasure ROM and reassess goals.    Consulted and Agree with Plan of Care Patient      Patient will benefit from skilled therapeutic intervention in order to improve the following deficits and impairments:  Decreased range of motion, Decreased strength, Impaired UE functional use, Pain  Visit Diagnosis: Acute pain of left shoulder  Stiffness of left shoulder, not elsewhere classified  Muscle weakness (generalized)  Abnormal posture     Problem List Patient Active Problem List   Diagnosis Date Noted  . Acute bronchitis 05/13/2016  . Breast cancer of upper-inner quadrant of left female breast ([place] 08/29/2015  . Routine general medical examination at a health care facility 08/13/2015  . Special screening for malignant neoplasms, colon   . Morbid obesity ([place] 08/03/2014    ROtelia Limes PTA 08/18/2016, 9:13 AM  [place] [address]Phone: [phone]  Fax:  [phone] Name: Maria RETTINGERMRN: [id number] of Birth: [birth year]

## 2016-08-19 ENCOUNTER — Other Ambulatory Visit (INDEPENDENT_AMBULATORY_CARE_PROVIDER_SITE_OTHER): Payer: BLUE CROSS/BLUE SHIELD

## 2016-08-19 ENCOUNTER — Encounter: Payer: BLUE CROSS/BLUE SHIELD | Admitting: Physical Therapy

## 2016-08-19 ENCOUNTER — Ambulatory Visit (INDEPENDENT_AMBULATORY_CARE_PROVIDER_SITE_OTHER): Payer: BLUE CROSS/BLUE SHIELD | Admitting: Internal Medicine

## 2016-08-19 ENCOUNTER — Encounter: Payer: Self-pay | Admitting: Internal Medicine

## 2016-08-19 VITALS — BP 142/76 | HR 91 | Temp 98.0°F | Resp 12 | Ht 60.0 in | Wt 251.0 lb

## 2016-08-19 DIAGNOSIS — Z1159 Encounter for screening for other viral diseases: Secondary | ICD-10-CM

## 2016-08-19 DIAGNOSIS — Z0000 Encounter for general adult medical examination without abnormal findings: Secondary | ICD-10-CM

## 2016-08-19 LAB — COMPREHENSIVE METABOLIC PANEL
ALT: 10 U/L (ref 0–35)
AST: 15 U/L (ref 0–37)
Albumin: 3.8 g/dL (ref 3.5–5.2)
Alkaline Phosphatase: 64 U/L (ref 39–117)
BUN: 14 mg/dL (ref 6–23)
CO2: 29 mEq/L (ref 19–32)
Calcium: 10.1 mg/dL (ref 8.4–10.5)
Chloride: 105 mEq/L (ref 96–112)
Creatinine, Ser: 0.81 mg/dL (ref 0.40–1.20)
GFR: 93.97 mL/min (ref >60.00)
Glucose, Bld: 81 mg/dL (ref 70–99)
Potassium: 3.8 mEq/L (ref 3.5–5.1)
Sodium: 139 mEq/L (ref 135–145)
Total Bilirubin: 0.4 mg/dL (ref 0.2–1.2)
Total Protein: 7.1 g/dL (ref 6.0–8.3)

## 2016-08-19 LAB — CBC
HCT: 41 % (ref 36.0–46.0)
Hemoglobin: 13.3 g/dL (ref 12.0–15.0)
MCHC: 32.4 g/dL (ref 30.0–36.0)
MCV: 87.8 fl (ref 78.0–100.0)
Platelets: 355 10*3/uL (ref 150.0–400.0)
RBC: 4.66 Mil/uL (ref 3.87–5.11)
RDW: 13.7 % (ref 11.5–15.5)
WBC: 4.1 10*3/uL (ref 4.0–10.5)

## 2016-08-19 LAB — LIPID PANEL
Cholesterol: 193 mg/dL (ref 0–200)
HDL: 58.5 mg/dL (ref >39.00)
LDL Cholesterol: 122 mg/dL — ABNORMAL HIGH (ref 0–99)
NonHDL: 134.77
Total CHOL/HDL Ratio: 3
Triglycerides: 64 mg/dL (ref 0.0–149.0)
VLDL: 12.8 mg/dL (ref 0.0–40.0)

## 2016-08-19 LAB — HEMOGLOBIN A1C: Hgb A1c MFr Bld: 5.9 % (ref 4.6–6.5)

## 2016-08-19 LAB — CYTOLOGY - PAP

## 2016-08-19 NOTE — Assessment & Plan Note (Signed)
Checking labs including hep c screening. Up to date on mammogram and colonoscopy. Declines needs for hiv screening. Declines tetanus today. Counseled on sun safety and mole surveillance. Given screening recommendations.

## 2016-08-19 NOTE — Assessment & Plan Note (Signed)
Checking HgA1c, lipid panel for complications. She is going to work on exercise and diet.

## 2016-08-19 NOTE — Progress Notes (Signed)
Pre visit review using our clinic review tool, if applicable. No additional management support is needed unless otherwise documented below in the visit note. 

## 2016-08-19 NOTE — Progress Notes (Signed)
   Subjective:    Patient ID: Maria Allen, female    DOB: [birth year], 56 y.o.   MRN: [id number]  HPI The patient is a 56 YO female coming in for wellness. No new concerns.   PMH, [facility], social history reviewed and updated.   Review of Systems  Constitutional: Negative.   HENT: Negative.   Eyes: Negative.   Respiratory: Negative for cough, chest tightness and shortness of breath.   Cardiovascular: Negative for chest pain, palpitations and leg swelling.  Gastrointestinal: Negative for abdominal distention, abdominal pain, constipation, diarrhea, nausea and vomiting.  Musculoskeletal: Negative.   Skin: Negative.   Neurological: Negative.   Psychiatric/Behavioral: Negative.       Objective:   Physical Exam  Constitutional: She is oriented to person, place, and time. She appears well-developed and well-nourished.  HENT:  Head: Normocephalic and atraumatic.  Eyes: EOM are normal.  Neck: Normal range of motion.  Cardiovascular: Normal rate and regular rhythm.   Pulmonary/Chest: Effort normal and breath sounds normal. No respiratory distress. She has no wheezes. She has no rales.  Abdominal: Soft. Bowel sounds are normal. She exhibits no distension. There is no tenderness. There is no rebound.  Musculoskeletal: She exhibits no edema.  Neurological: She is alert and oriented to person, place, and time. Coordination normal.  Skin: Skin is warm and dry.  Psychiatric: She has a normal mood and affect.   Vitals:   08/19/16 0956 08/19/16 1023  BP: (!) 150/84 (!) 142/76  Pulse: 91   Resp: 12   Temp: 98 F (36.7 C)   TempSrc: Oral   SpO2: 99%   Weight: 251 lb (113.9 kg)   Height: 5' (1.524 m)       Assessment & Plan:

## 2016-08-19 NOTE — Patient Instructions (Signed)
We will check the labs today and call you back with the results.   Health Maintenance, Female Adopting a healthy lifestyle and getting preventive care can go a long way to promote health and wellness. Talk with your health care provider about what schedule of regular examinations is right for you. This is a good chance for you to check in with your provider about disease prevention and staying healthy. In between checkups, there are plenty of things you can do on your own. Experts have done a lot of research about which lifestyle changes and preventive measures are most likely to keep you healthy. Ask your health care provider for more information. Weight and diet Eat a healthy diet  Be sure to include plenty of vegetables, fruits, low-fat dairy products, and lean protein.  Do not eat a lot of foods high in solid fats, added sugars, or salt.  Get regular exercise. This is one of the most important things you can do for your health.  Most adults should exercise for at least 150 minutes each week. The exercise should increase your heart rate and make you sweat (moderate-intensity exercise).  Most adults should also do strengthening exercises at least twice a week. This is in addition to the moderate-intensity exercise. Maintain a healthy weight  Body mass index (BMI) is a measurement that can be used to identify possible weight problems. It estimates body fat based on height and weight. Your health care provider can help determine your BMI and help you achieve or maintain a healthy weight.  For females 61 years of age and older:  A BMI below 18.5 is considered underweight.  A BMI of 18.5 to 24.9 is normal.  A BMI of 25 to 29.9 is considered overweight.  A BMI of 30 and above is considered obese. Watch levels of cholesterol and blood lipids  You should start having your blood tested for lipids and cholesterol at 56 years of age, then have this test every 5 years.  You may need to have  your cholesterol levels checked more often if:  Your lipid or cholesterol levels are high.  You are older than 56 years of age.  You are at high risk for heart disease. Cancer screening Lung Cancer  Lung cancer screening is recommended for adults 38-12 years old who are at high risk for lung cancer because of a history of smoking.  A yearly low-dose CT scan of the lungs is recommended for people who:  Currently smoke.  Have quit within the past 15 years.  Have at least a 30-pack-year history of smoking. A pack year is smoking an average of one pack of cigarettes a day for 1 year.  Yearly screening should continue until it has been 15 years since you quit.  Yearly screening should stop if you develop a health problem that would prevent you from having lung cancer treatment. Breast Cancer  Practice breast self-awareness. This means understanding how your breasts normally appear and feel.  It also means doing regular breast self-exams. Let your health care provider know about any changes, no matter how small.  If you are in your 20s or 30s, you should have a clinical breast exam (CBE) by a health care provider every 1-3 years as part of a regular health exam.  If you are 71 or older, have a CBE every year. Also consider having a breast X-ray (mammogram) every year.  If you have a family history of breast cancer, talk to your health care  provider about genetic screening.  If you are at high risk for breast cancer, talk to your health care provider about having an MRI and a mammogram every year.  Breast cancer gene (BRCA) assessment is recommended for women who have family members with BRCA-related cancers. BRCA-related cancers include:  Breast.  Ovarian.  Tubal.  Peritoneal cancers.  Results of the assessment will determine the need for genetic counseling and BRCA1 and BRCA2 testing. Cervical Cancer  Your health care provider may recommend that you be screened regularly  for cancer of the pelvic organs (ovaries, uterus, and vagina). This screening involves a pelvic examination, including checking for microscopic changes to the surface of your cervix (Pap test). You may be encouraged to have this screening done every 3 years, beginning at age 21.  For women ages 30-65, health care providers may recommend pelvic exams and Pap testing every 3 years, or they may recommend the Pap and pelvic exam, combined with testing for human papilloma virus (HPV), every 5 years. Some types of HPV increase your risk of cervical cancer. Testing for HPV may also be done on women of any age with unclear Pap test results.  Other health care providers may not recommend any screening for nonpregnant women who are considered low risk for pelvic cancer and who do not have symptoms. Ask your health care provider if a screening pelvic exam is right for you.  If you have had past treatment for cervical cancer or a condition that could lead to cancer, you need Pap tests and screening for cancer for at least 20 years after your treatment. If Pap tests have been discontinued, your risk factors (such as having a new sexual partner) need to be reassessed to determine if screening should resume. Some women have medical problems that increase the chance of getting cervical cancer. In these cases, your health care provider may recommend more frequent screening and Pap tests. Colorectal Cancer  This type of cancer can be detected and often prevented.  Routine colorectal cancer screening usually begins at 56 years of age and continues through 56 years of age.  Your health care provider may recommend screening at an earlier age if you have risk factors for colon cancer.  Your health care provider may also recommend using home test kits to check for hidden blood in the stool.  A small camera at the end of a tube can be used to examine your colon directly (sigmoidoscopy or colonoscopy). This is done to check  for the earliest forms of colorectal cancer.  Routine screening usually begins at age 50.  Direct examination of the colon should be repeated every 5-10 years through 56 years of age. However, you may need to be screened more often if early forms of precancerous polyps or small growths are found. Skin Cancer  Check your skin from head to toe regularly.  Tell your health care provider about any new moles or changes in moles, especially if there is a change in a mole's shape or color.  Also tell your health care provider if you have a mole that is larger than the size of a pencil eraser.  Always use sunscreen. Apply sunscreen liberally and repeatedly throughout the day.  Protect yourself by wearing long sleeves, pants, a wide-brimmed hat, and sunglasses whenever you are outside. Heart disease, diabetes, and high blood pressure  High blood pressure causes heart disease and increases the risk of stroke. High blood pressure is more likely to develop in:  People who   have blood pressure in the high end of the normal range (130-139/85-89 mm Hg).  People who are overweight or obese.  People who are African American.  If you are 66-44 years of age, have your blood pressure checked every 3-5 years. If you are 81 years of age or older, have your blood pressure checked every year. You should have your blood pressure measured twice-once when you are at a hospital or clinic, and once when you are not at a hospital or clinic. Record the average of the two measurements. To check your blood pressure when you are not at a hospital or clinic, you can use:  An automated blood pressure machine at a pharmacy.  A home blood pressure monitor.  If you are between 53 years and 57 years old, ask your health care provider if you should take aspirin to prevent strokes.  Have regular diabetes screenings. This involves taking a blood sample to check your fasting blood sugar level.  If you are at a normal weight  and have a low risk for diabetes, have this test once every three years after 56 years of age.  If you are overweight and have a high risk for diabetes, consider being tested at a younger age or more often. Preventing infection Hepatitis B  If you have a higher risk for hepatitis B, you should be screened for this virus. You are considered at high risk for hepatitis B if:  You were born in a country where hepatitis B is common. Ask your health care provider which countries are considered high risk.  Your parents were born in a high-risk country, and you have not been immunized against hepatitis B (hepatitis B vaccine).  You have HIV or AIDS.  You use needles to inject street drugs.  You live with someone who has hepatitis B.  You have had sex with someone who has hepatitis B.  You get hemodialysis treatment.  You take certain medicines for conditions, including cancer, organ transplantation, and autoimmune conditions. Hepatitis C  Blood testing is recommended for:  Everyone born from [birth year] through 1965.  Anyone with known risk factors for hepatitis C. Sexually transmitted infections (STIs)  You should be screened for sexually transmitted infections (STIs) including gonorrhea and chlamydia if:  You are sexually active and are younger than 56 years of age.  You are older than 56 years of age and your health care provider tells you that you are at risk for this type of infection.  Your sexual activity has changed since you were last screened and you are at an increased risk for chlamydia or gonorrhea. Ask your health care provider if you are at risk.  If you do not have HIV, but are at risk, it may be recommended that you take a prescription medicine daily to prevent HIV infection. This is called pre-exposure prophylaxis (PrEP). You are considered at risk if:  You are sexually active and do not regularly use condoms or know the HIV status of your partner(s).  You take drugs by  injection.  You are sexually active with a partner who has HIV. Talk with your health care provider about whether you are at high risk of being infected with HIV. If you choose to begin PrEP, you should first be tested for HIV. You should then be tested every 3 months for as long as you are taking PrEP. Pregnancy  If you are premenopausal and you may become pregnant, ask your health care provider about preconception counseling.  If you may become pregnant, take 400 to 800 micrograms (mcg) of folic acid every day.  If you want to prevent pregnancy, talk to your health care provider about birth control (contraception). Osteoporosis and menopause  Osteoporosis is a disease in which the bones lose minerals and strength with aging. This can result in serious bone fractures. Your risk for osteoporosis can be identified using a bone density scan.  If you are 37 years of age or older, or if you are at risk for osteoporosis and fractures, ask your health care provider if you should be screened.  Ask your health care provider whether you should take a calcium or vitamin D supplement to lower your risk for osteoporosis.  Menopause may have certain physical symptoms and risks.  Hormone replacement therapy may reduce some of these symptoms and risks. Talk to your health care provider about whether hormone replacement therapy is right for you. Follow these instructions at home:  Schedule regular health, dental, and eye exams.  Stay current with your immunizations.  Do not use any tobacco products including cigarettes, chewing tobacco, or electronic cigarettes.  If you are pregnant, do not drink alcohol.  If you are breastfeeding, limit how much and how often you drink alcohol.  Limit alcohol intake to no more than 1 drink per day for nonpregnant women. One drink equals 12 ounces of beer, 5 ounces of wine, or 1 ounces of hard liquor.  Do not use street drugs.  Do not share needles.  Ask  your health care provider for help if you need support or information about quitting drugs.  Tell your health care provider if you often feel depressed.  Tell your health care provider if you have ever been abused or do not feel safe at home. This information is not intended to replace advice given to you by your health care provider. Make sure you discuss any questions you have with your health care provider. Document Released: 11/04/2010 Document Revised: 09/27/2015 Document Reviewed: 01/23/2015 Elsevier Interactive Patient Education  2017 [employer].

## 2016-08-20 ENCOUNTER — Ambulatory Visit: Payer: BLUE CROSS/BLUE SHIELD | Admitting: Physical Therapy

## 2016-08-20 DIAGNOSIS — M25612 Stiffness of left shoulder, not elsewhere classified: Secondary | ICD-10-CM

## 2016-08-20 DIAGNOSIS — M25512 Pain in left shoulder: Secondary | ICD-10-CM

## 2016-08-20 DIAGNOSIS — M6281 Muscle weakness (generalized): Secondary | ICD-10-CM

## 2016-08-20 LAB — HEPATITIS C ANTIBODY: HCV Ab: NEGATIVE

## 2016-08-20 NOTE — Therapy (Signed)
[place], [name], [address] Phone: [phone]   Fax:  [phone]  Physical Therapy Treatment  Patient Details  Name: Maria Allen MRN: [id number] Date of Birth: 25-May-1960 Referring Provider: Barry Dienes  Encounter Date: 08/20/2016      PT End of Session - 08/20/16 1727    Visit Number 10   Date for PT Re-Evaluation 08/18/16   PT Start Time 1303   PT Stop Time 1347   PT Time Calculation (min) 44 min   Activity Tolerance Patient tolerated treatment well   Behavior During Therapy [facility] for tasks assessed/performed      Past Medical History:  Diagnosis Date  . Anxiety   . Breast cancer ([place]) 08/27/15   left  . Breast cancer of upper-inner quadrant of left female breast ([place]) 08/29/2015  . Migraine   . Personal history of radiation therapy     Past Surgical History:  Procedure Laterality Date  . BREAST BIOPSY Left 08/27/2015  . BREAST LUMPECTOMY Left 10/09/2015  . BREAST LUMPECTOMY WITH RADIOACTIVE SEED AND SENTINEL LYMPH NODE BIOPSY Left 10/09/2015   Procedure: BREAST LUMPECTOMY WITH RADIOACTIVE SEED AND SENTINEL LYMPH NODE BIOPSY;  Surgeon: Stark Klein, MD;  Location: [facility];  Service: General;  Laterality: Left;  . COLONOSCOPY WITH PROPOFOL N/A 11/13/2014   Procedure: COLONOSCOPY WITH PROPOFOL;  Surgeon: Jerene Bears, MD;  Location: [facility];  Service: Gastroenterology;  Laterality: N/A;  . NO PAST SURGERIES      There were no vitals filed for this visit.      Subjective Assessment - 08/20/16 1304    Subjective Everything is going well. No problems with home exercises.  Started yoga at [employer] yesterday.  "My first mammogram came back negative."   Currently in Pain? No/denies            [facility] PT Assessment - 08/20/16 0001      AROM   Left Shoulder ABduction 165 Degrees                     [facility] Adult PT Treatment/Exercise - 08/20/16 0001      Self-Care    Self-Care Other Self-Care Comments   Other Self-Care Comments  Discussed lymphedema risk reduction, how to obtain a compression sleeve     Shoulder Exercises: Standing   Flexion Strengthening;Both;10 reps;Weights  to 90 degrees only   Shoulder Flexion Weight (lbs) 2   ABduction Strengthening;Both;10 reps;Weights  2 lbs, to 90 degrees only   Other Standing Exercises scaption 2 lbs. x 10 to 90 degrees only     Manual Therapy   Manual Therapy Scapular mobilization;Other (comment)   Soft tissue mobilization to left pec, upper traps and levator scapula   Myofascial Release to left UE, pulling in supine with movement into abduction   Scapular Mobilization to left side in right sidelying   Passive ROM In supine for left shoulder er, abduction, flexion                PT Education - 08/20/16 1321    Education provided Yes   Education Details 3-way arm raises with 2 lb. weights x 10 each   Person(s) Educated Patient   Methods Explanation;Demonstration;Verbal cues   Comprehension Verbalized understanding;Returned demonstration                [facility] Goals - 08/20/16 1305      CC Long Term Goal  #1   Title  Pt will report a 75% improvement in pain and discomfort in left shoulder to increase comfort.   Baseline 70% improvement at this time-08/13/16; 90% as of 08/20/16   Status Achieved     CC Long Term Goal  #2   Title Pt will be able to independently verbalize lymphedema risk reduction practices   Status Achieved     CC Long Term Goal  #3   Title Pt will be independent in a home exercise program for strengthening and stretching   Status Achieved     CC Long Term Goal  #4   Title Pt will demonstrate 160 degrees of left shoulder abduction to allow her to reach out to her side   Baseline 141; 168 degrees-08/13/16   Status Achieved            Plan - 08/20/16 1728    Clinical Impression Statement Pt. is doing well and pleased with her progress, so is ready  for discharge.     Rehab Potential Excellent   Clinical Impairments Affecting Rehab Potential none   PT Treatment/Interventions ADLs/Self Care Home Management;Patient/family education;Passive range of motion;Manual techniques;Therapeutic exercise;Iontophoresis 3m/ml Dexamethasone   PT Next Visit Plan Discharge today   PT Home Exercise Plan decrease overhead activities, keep upper arm close to body when shampooing hair ; [place] and supine scapular series with green theraband, isometrics   Consulted and Agree with Plan of Care Patient      Patient will benefit from skilled therapeutic intervention in order to improve the following deficits and impairments:  Decreased range of motion, Decreased strength, Impaired UE functional use, Pain  Visit Diagnosis: Acute pain of left shoulder  Stiffness of left shoulder, not elsewhere classified  Muscle weakness (generalized)     Problem List Patient Active Problem List   Diagnosis Date Noted  . Breast cancer of upper-inner quadrant of left female breast ([facility] 08/29/2015  . Routine general medical examination at a health care facility 08/13/2015  . Morbid obesity ([facility] 08/03/2014    Rhiley Solem 08/20/2016, 5:29 PM  [place] [address]Phone: [phone]  Fax:  [phone] Name: Maria BORAKMRN: [id number] of Birth: [birth year] PHYSICAL THERAPY DISCHARGE SUMMARY  Visits from Start of Care: 10  Current functional level related to goals / functional outcomes: All goals met as noted above.   Remaining deficits: Still with mild tightness.   Education / Equipment: Home exercise program, lymphedema risk reduction practices, how to obtain a compression sleeve. Plan: Patient agrees to discharge.  Patient goals were met. Patient is being discharged due to meeting the stated rehab goals.  ?????    She is pleased with her  progress.  DSerafina Royals PT 08/20/16 5:31 PM

## 2016-09-04 IMAGING — MR MR BREAST BILAT WO/W CM
8 of 12 series · 33 of 48 positions shown · IV contrast (20ml Multihance)
Comparison: Previous exam(s).

CLINICAL DATA: 55-year-old female with newly diagnosed left breast
cancer.

LABS:  None performed today.
EXAM:
BILATERAL BREAST MRI WITH AND WITHOUT CONTRAST
TECHNIQUE: Multiplanar, multisequence MR images of both breasts were obtained
prior to and following the intravenous administration of 20 ml of
MultiHance.

[Series 2: t2_tirm_tra ipat (a-p) · axial · 3.0mm · 0.78mm/px · 1 of 60 slices shown]
[im 1/60]
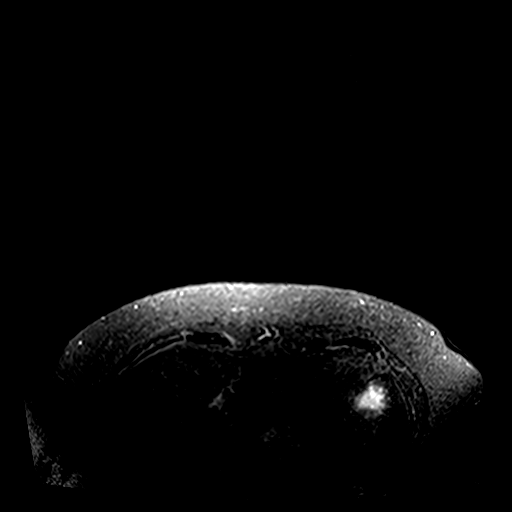

[Series 3: fl3d pre-cm no · axial · non-contrast · 1.2mm · 1.04mm/px · z∈[-105,+85]mm · 5 of 160 slices shown]
[im 1/160]
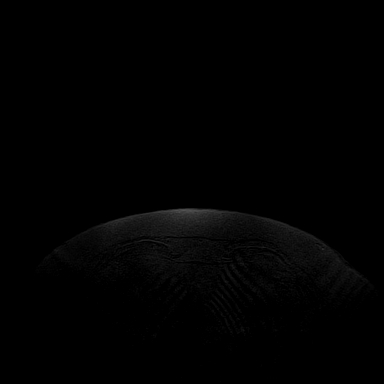
[im 40/160]
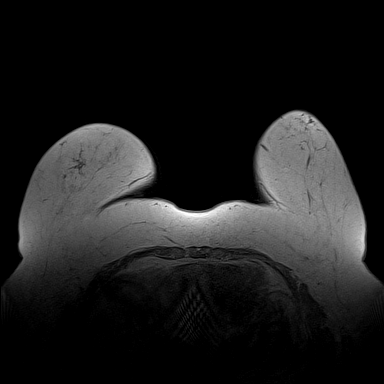
[im 80/160]
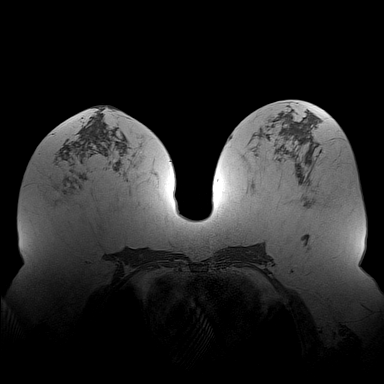
[im 120/160]
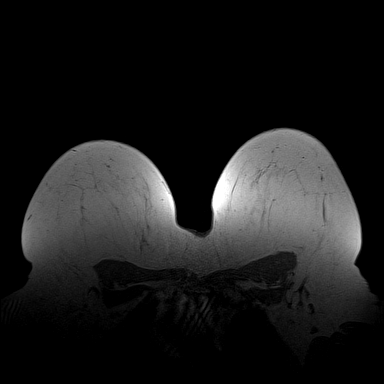
[im 160/160]
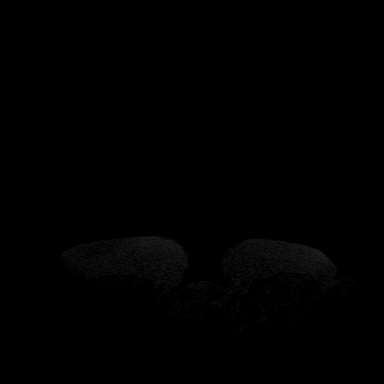

[Series 4: fl3d pre-cm · axial · non-contrast · 1.2mm · 1.04mm/px · z∈[-105,+85]mm · 5 of 160 slices shown]
[im 1/160]
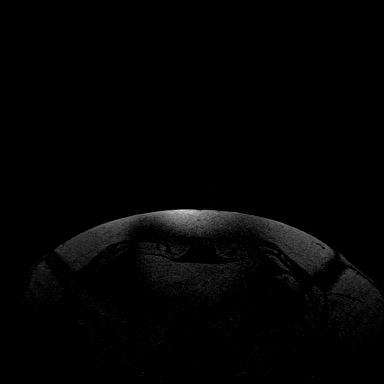
[im 40/160]
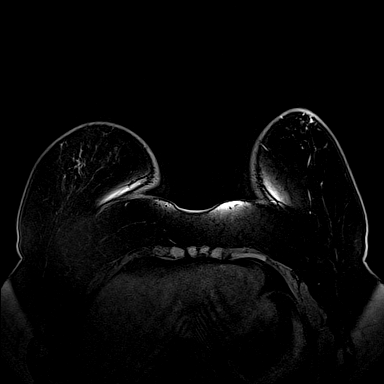
[im 80/160]
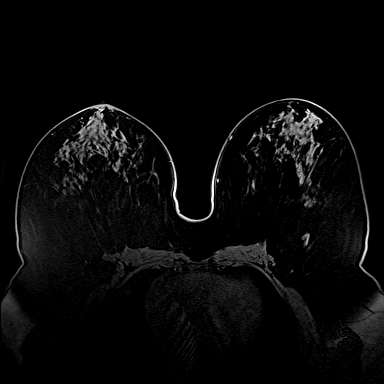
[im 120/160]
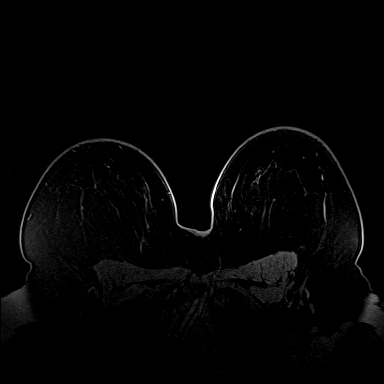
[im 160/160]
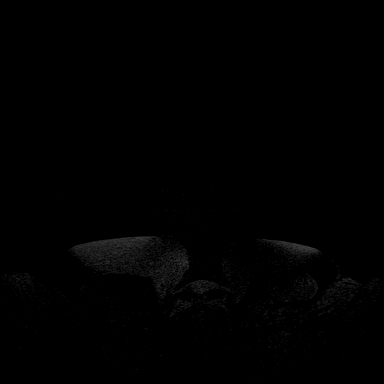

[Series 5: fl3d post-cm 20 · axial · 1.2mm · 1.04mm/px · z∈[-105,+85]mm · 5 of 160 slices shown (1 of 3)]
[im 1/160]
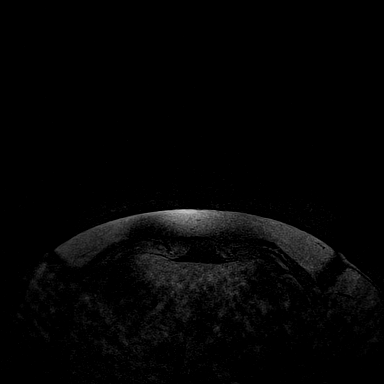
[im 40/160]
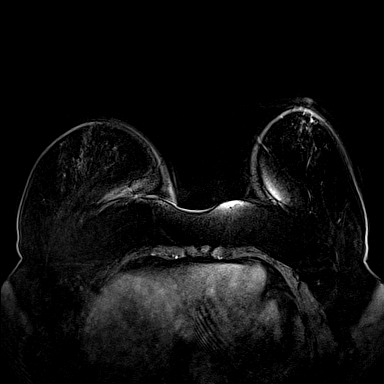
[im 80/160]
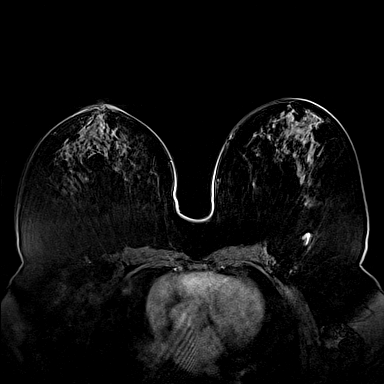
[im 120/160]
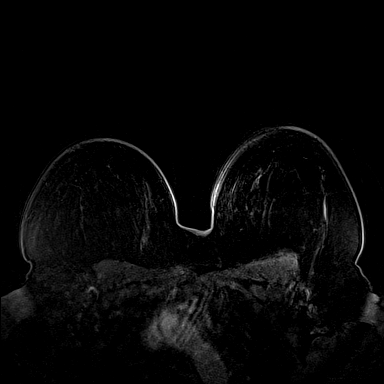
[im 160/160]
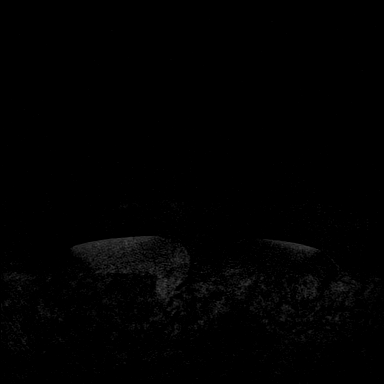

[Series 6: fl3d post-cm 20 · axial · 1.2mm · 1.04mm/px · z∈[-105,+85]mm · 5 of 160 slices shown (2 of 3)]
[im 1/160]
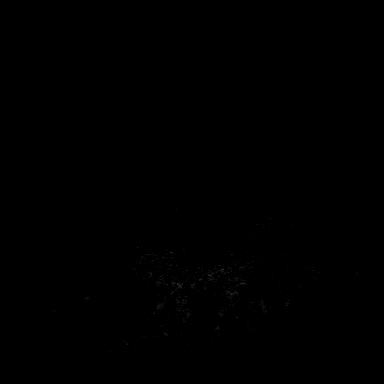
[im 40/160]
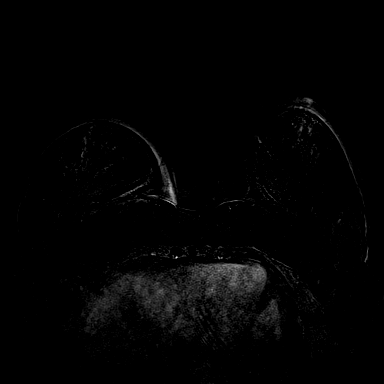
[im 80/160]
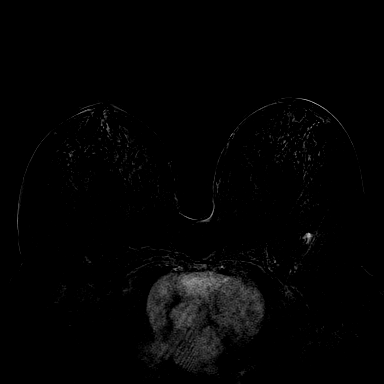
[im 120/160]
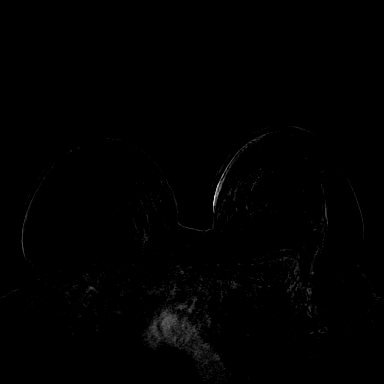
[im 160/160]
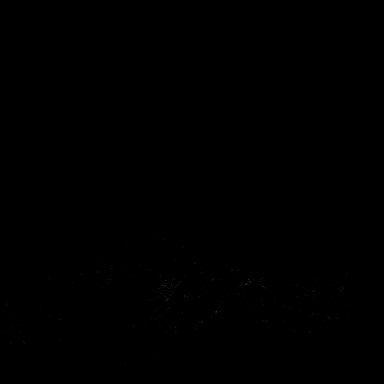

[Series 7: fl3d post-cm 20 · axial · 192.0mm · 1.04mm/px · 1 of 1 slices shown (3 of 3)]
[im 1/1]
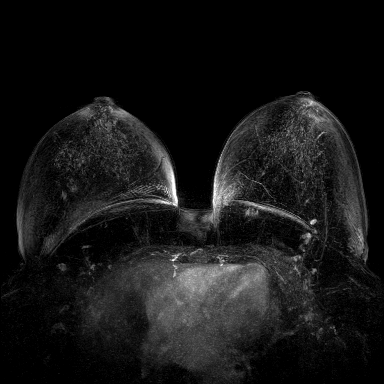

[Series 8: fl3d post-cm 3min · axial · 1.2mm · 1.04mm/px · z∈[-105,+85]mm · 6 of 160 slices shown]
[im 1/160]
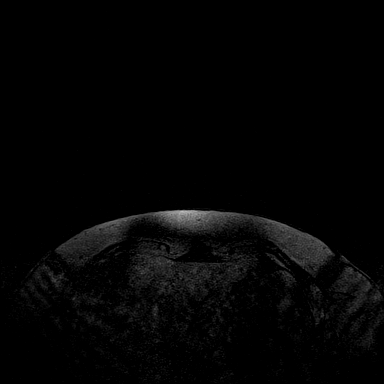
[im 32/160]
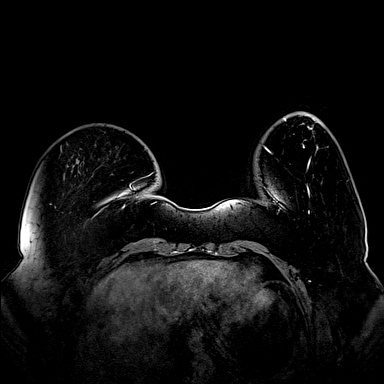
[im 64/160]
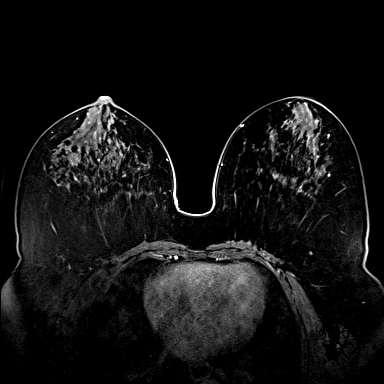
[im 96/160]
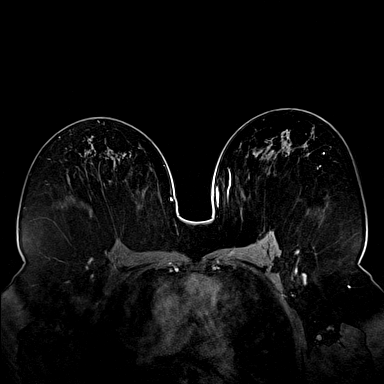
[im 128/160]
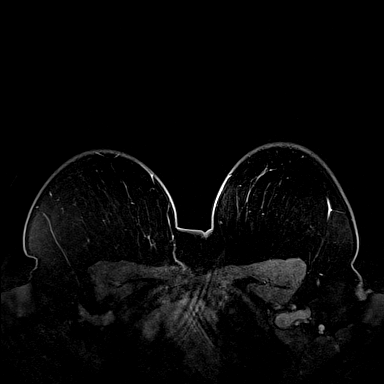
[im 160/160]
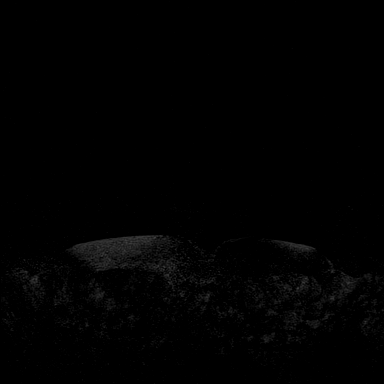

[Series 9: fl3d post-cm 3min_sub · axial · 1.2mm · 1.04mm/px · z∈[-105,+47]mm · 5 of 160 slices shown]
[im 1/160]
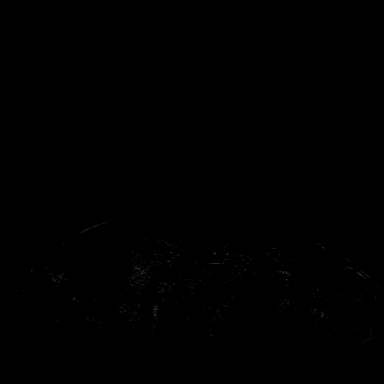
[im 32/160]
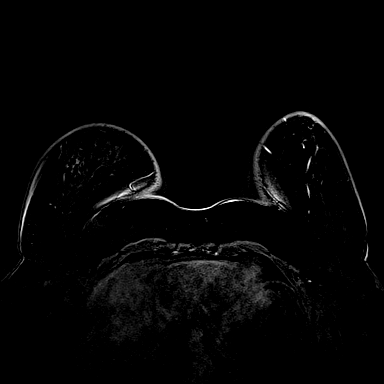
[im 64/160]
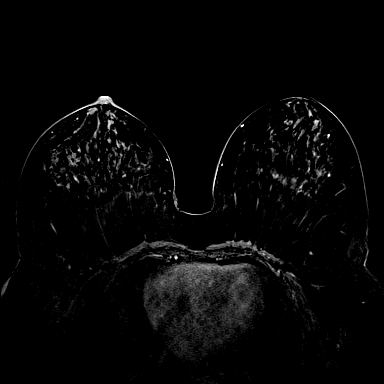
[im 96/160]
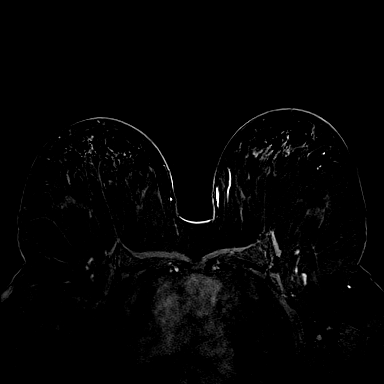
[im 128/160]
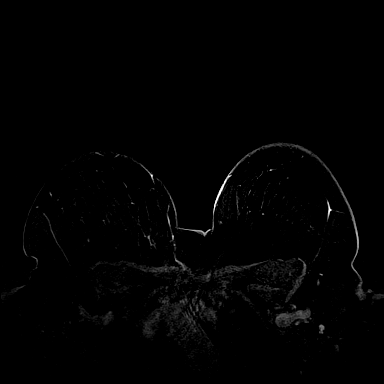

[33 of 48 positions shown; findings below may reference images not displayed]

THREE-DIMENSIONAL MR IMAGE RENDERING ON INDEPENDENT WORKSTATION:

Three-dimensional MR images were rendered by post-processing of the
original MR data on an independent workstation. The
three-dimensional MR images were interpreted, and findings are
reported in the following complete MRI report for this study. Three
dimensional images were evaluated at the independent [employer]
workstation
FINDINGS: Breast composition: c. Heterogeneous fibroglandular tissue.

Background parenchymal enhancement: Mild

Right breast: No mass or abnormal enhancement.

Left breast: A 0.9 cm spiculated enhancing mass within the upper
inner left breast (middle and posterior third junction) contains
biopsy clip artifact and compatible with recently diagnosed
biopsy-proven malignancy.

No other suspicious mass or abnormal enhancement identified.

Lymph nodes: No abnormal appearing lymph nodes.

Ancillary findings:  None.
IMPRESSION: 0.9 cm biopsy-proven malignancy within the upper inner left breast.
No evidence of multifocal, multicentric or contralateral malignancy.
No abnormal appearing lymph nodes.

RECOMMENDATION:
Treatment plan

BI-RADS CATEGORY  6: Known biopsy-proven malignancy.

## 2016-10-04 ENCOUNTER — Other Ambulatory Visit: Payer: Self-pay | Admitting: Hematology and Oncology

## 2016-10-15 ENCOUNTER — Telehealth: Payer: Self-pay | Admitting: Hematology and Oncology

## 2016-10-15 NOTE — Telephone Encounter (Signed)
Spoke with patient re new f/u 7/2 - moved from 6/18 due to [facility]

## 2016-10-20 ENCOUNTER — Ambulatory Visit: Payer: BLUE CROSS/BLUE SHIELD | Admitting: Hematology and Oncology

## 2016-11-03 ENCOUNTER — Encounter: Payer: Self-pay | Admitting: Hematology and Oncology

## 2016-11-03 ENCOUNTER — Ambulatory Visit (HOSPITAL_BASED_OUTPATIENT_CLINIC_OR_DEPARTMENT_OTHER): Payer: BLUE CROSS/BLUE SHIELD | Admitting: Hematology and Oncology

## 2016-11-03 DIAGNOSIS — C50212 Malignant neoplasm of upper-inner quadrant of left female breast: Secondary | ICD-10-CM | POA: Diagnosis not present

## 2016-11-03 DIAGNOSIS — Z170 Estrogen receptor positive status [ER+]: Secondary | ICD-10-CM

## 2016-11-03 MED ORDER — ANASTROZOLE 1 MG PO TABS: 1.0000 mg | ORAL_TABLET | Freq: Every day | ORAL | 3 refills | Status: DC

## 2016-11-03 NOTE — Assessment & Plan Note (Signed)
Left lumpectomy: Invasive lobular carcinoma grade 1, 0.9 cm, ALH, margins negative, 0/3 lymph nodes negative, T1bN0 stage IA, ER 80%, PR 95%, HER-2 negative ratio 0.97, Ki-67 10% Oncotype DX score 21, 14% risk of recurrence intermediate risk  Adjuvant radiation therapy from 11/21/2015 to 01/10/2016  Current treatment: Antiestrogen therapy with anastrozole 1 mg by mouth daily 5 years started October 2017  Anastrozole toxicities: 1. Intermittent hot flashes 2. muscle aches and pains that are manageable  Instructed the patient to take it at bedtime She is taking tumeric We talked about tonic water  Constipation: Resolved   Return to clinic in 1 year for follow-up

## 2016-11-03 NOTE — Progress Notes (Signed)
Patient Care Team: Hoyt Koch, MD as PCP - General (Internal Medicine)  DIAGNOSIS:  Encounter Diagnosis  Name Primary?  . Malignant neoplasm of upper-inner quadrant of left breast in female, estrogen receptor positive ([place])     SUMMARY OF ONCOLOGIC HISTORY:   Breast cancer of upper-inner quadrant of left female breast ([place])   08/27/2015 Initial Diagnosis    Left breast biopsy 11:00: Invasive lobular cancer with LCIS, grade 1,ER 80%, PR 95%, Ki-67 10%, HER-2 Negativeratio 0.97, T1 BN 0 stage I a clinical stage      08/27/2015 Mammogram    Screening detected left breast asymmetry posteriorly at 11:00 position 9 x 8 x 5 mm by ultrasound, axilla negative      10/09/2015 Surgery    Left lumpectomy Barry Dienes): Invasive lobular carcinoma grade 1, 0.9 cm, ALH, margins negative, 0/3 lymph nodes negative, T1bN0 stage IA, ER 80%, PR 95%, HER-2 negative ratio 0.97, Ki-67 10%, Oncotype DX 21, 14% ROR      10/09/2015 Oncotype testing    Recurrence score: 21; 14% ROR (intermediate-risk)       11/21/2015 - 01/10/2016 Radiation Therapy    Adjuvant radiation therapy Lisbeth Renshaw). Left breast: 50.4 Gy in 28 fractions. Left breast boost: 10 Gy in 5 fractions       02/2016 -  Anti-estrogen oral therapy    Anastrozole 1 mg daily. Planned duration of therapy: 5 years        CHIEF COMPLIANT: Follow-up on anastrozole therapy  INTERVAL HISTORY: Maria Allen is a 56 year old above-mentioned history left breast cancer treated with lumpectomy and radiation is currently on anastrozole therapy. She is tolerating anastrozole extremely well. She does have muscle stiffness and achiness but with yoga and physical therapy the symptoms have improved. She also tells me that taking it at bedtime has helped improve her symptoms. She has been trying to lose some weight and had lost about 8-10 pounds.  REVIEW OF SYSTEMS:   Constitutional: Denies fevers, chills or abnormal weight loss Eyes: Denies blurriness of  vision Ears, nose, mouth, throat, and face: Denies mucositis or sore throat Respiratory: Denies cough, dyspnea or wheezes Cardiovascular: Denies palpitation, chest discomfort Gastrointestinal:  Denies nausea, heartburn or change in bowel habits Skin: Denies abnormal skin rashes Lymphatics: Denies new lymphadenopathy or easy bruising Neurological:Denies numbness, tingling or new weaknesses Behavioral/Psych: Mood is stable, no new changes  Extremities: No lower extremity edema Breast:  denies any pain or lumps or nodules in either breasts All other systems were reviewed with the patient and are negative.  I have reviewed the past medical history, past surgical history, social history and family history with the patient and they are unchanged from previous note.  ALLERGIES:  is allergic to gadolinium derivatives.  MEDICATIONS:  Current Outpatient Prescriptions  Medication Sig Dispense Refill  . anastrozole (ARIMIDEX) 1 MG tablet TAKE ONE TABLET BY MOUTH DAILY 60 tablet 5  . aspirin 325 MG tablet Take 650 mg by mouth daily as needed for moderate pain or headache. Reported on 09/05/2015    . calcium gluconate 500 MG tablet Take 1 tablet by mouth 2 (two) times daily.    . cholecalciferol (VITAMIN D) 1000 units tablet Take 1,000 Units by mouth daily.    Marland Kitchen glucosamine-chondroitin 500-400 MG tablet Take 1 tablet by mouth 3 (three) times daily.    . hyaluronate sodium (RADIAPLEXRX) GEL Apply 1 application topically once.    . non-metallic deodorant Jethro Poling) MISC Apply 1 application topically daily as needed.    Marland Kitchen  phentermine 37.5 MG capsule Take 37.5 mg by mouth every morning.    . TURMERIC PO Take 1 tablet by mouth.    . vitamin C (ASCORBIC ACID) 250 MG tablet Take 1,000 mg by mouth 2 (two) times daily.      No current facility-administered medications for this visit.     PHYSICAL EXAMINATION: ECOG PERFORMANCE STATUS: 0 - Asymptomatic  Vitals:   11/03/16 0938  BP: (!) 142/87  Pulse: 84    Resp: 18  Temp: 97.8 F (36.6 C)   Filed Weights   11/03/16 0938  Weight: 241 lb 14.4 oz (109.7 kg)    GENERAL:alert, no distress and comfortable SKIN: skin color, texture, turgor are normal, no rashes or significant lesions EYES: normal, Conjunctiva are pink and non-injected, sclera clear OROPHARYNX:no exudate, no erythema and lips, buccal mucosa, and tongue normal  NECK: supple, thyroid normal size, non-tender, without nodularity LYMPH:  no palpable lymphadenopathy in the cervical, axillary or inguinal LUNGS: clear to auscultation and percussion with normal breathing effort HEART: regular rate & rhythm and no murmurs and no lower extremity edema ABDOMEN:abdomen soft, non-tender and normal bowel sounds MUSCULOSKELETAL:no cyanosis of digits and no clubbing  NEURO: alert & oriented x 3 with fluent speech, no focal motor/sensory deficits EXTREMITIES: No lower extremity edema  LABORATORY DATA:  I have reviewed the data as listed   Chemistry      Component Value Date/Time   NA 139 08/19/2016 1022   NA 139 09/05/2015 1302   K 3.8 08/19/2016 1022   K 4.2 09/05/2015 1302   CL 105 08/19/2016 1022   CO2 29 08/19/2016 1022   CO2 26 09/05/2015 1302   BUN 14 08/19/2016 1022   BUN 16.6 09/05/2015 1302   CREATININE 0.81 08/19/2016 1022   CREATININE 0.9 09/05/2015 1302      Component Value Date/Time   CALCIUM 10.1 08/19/2016 1022   CALCIUM 10.2 09/05/2015 1302   ALKPHOS 64 08/19/2016 1022   ALKPHOS 58 09/05/2015 1302   AST 15 08/19/2016 1022   AST 18 09/05/2015 1302   ALT 10 08/19/2016 1022   ALT 13 09/05/2015 1302   BILITOT 0.4 08/19/2016 1022   BILITOT 0.51 09/05/2015 1302       Lab Results  Component Value Date   WBC 4.1 08/19/2016   HGB 13.3 08/19/2016   HCT 41.0 08/19/2016   MCV 87.8 08/19/2016   PLT 355.0 08/19/2016   NEUTROABS 2.2 09/05/2015    ASSESSMENT & PLAN:  Breast cancer of upper-inner quadrant of left female breast ([facility]) Left lumpectomy: Invasive  lobular carcinoma grade 1, 0.9 cm, ALH, margins negative, 0/3 lymph nodes negative, T1bN0 stage IA, ER 80%, PR 95%, HER-2 negative ratio 0.97, Ki-67 10% Oncotype DX score 21, 14% risk of recurrence intermediate risk  Adjuvant radiation therapy from 11/21/2015 to 01/10/2016  Current treatment: Antiestrogen therapy with anastrozole 1 mg by mouth daily 5 years started October 2017  Anastrozole toxicities: 1. Intermittent hot flashes 2. muscle aches and pains that are manageable  Instructed the patient to take it at bedtime She is taking tumeric We talked about tonic water  Constipation: Resolved   Return to clinic in 1 year for follow-up   I spent 25 minutes talking to the patient of which more than half was spent in counseling and coordination of care.  No orders of the defined types were placed in this encounter.  The patient has a good understanding of the overall plan. she agrees with it. she will  call with any problems that may develop before the next visit here.   Rulon Eisenmenger, MD 11/03/16

## 2017-05-26 ENCOUNTER — Other Ambulatory Visit: Payer: Self-pay

## 2017-05-26 MED ORDER — ANASTROZOLE 1 MG PO TABS: 1.0000 mg | ORAL_TABLET | Freq: Every day | ORAL | 2 refills | Status: DC

## 2017-05-26 NOTE — Telephone Encounter (Signed)
Pt called to request a transfer of pharmacy from [facility] to [facility] on [address]. Pt states that she has been having a dfficult time refilling her anastrozole. Pt attempted to pick up a new refill from her pharmacy ([facility]) this past weekend and was told that her insurance copay way going to be more expensive.   Pt attempted to call her insurance and was told that there was no change from last year's coverage of prescriptions. Pt states that [facility] had only been giving her 30 days to 60 days supply at a time, when she was ordered 90 days. Pt states that the insurance company told her that she will not be able to refill until 2/1. She was very upset and does not want to deal with [facility] any longer.  Send transfer to new pharmacy and told pt that she will be okay to be off of anastrozole until the first. Tried to obtain free samples of anastrozole from [facility] center pharmacist but was unavailable at this time. Notified Dr.Gudena and aware. Pt will resume on 06/05/17. Informed pt that she should be refilling a 90 days supply and she will have 1 refill left, after she picks up her next prescription.   Pt verbalized understanding and has no further needs at this time.

## 2017-07-16 ENCOUNTER — Other Ambulatory Visit: Payer: Self-pay | Admitting: General Surgery

## 2017-07-16 DIAGNOSIS — Z853 Personal history of malignant neoplasm of breast: Secondary | ICD-10-CM

## 2017-08-24 ENCOUNTER — Ambulatory Visit (INDEPENDENT_AMBULATORY_CARE_PROVIDER_SITE_OTHER): Payer: BLUE CROSS/BLUE SHIELD | Admitting: Internal Medicine

## 2017-08-24 ENCOUNTER — Other Ambulatory Visit (INDEPENDENT_AMBULATORY_CARE_PROVIDER_SITE_OTHER): Payer: BLUE CROSS/BLUE SHIELD

## 2017-08-24 ENCOUNTER — Encounter: Payer: Self-pay | Admitting: Internal Medicine

## 2017-08-24 ENCOUNTER — Encounter: Payer: BLUE CROSS/BLUE SHIELD | Admitting: Internal Medicine

## 2017-08-24 VITALS — BP 140/98 | HR 75 | Temp 97.8°F | Ht 60.0 in | Wt 251.0 lb

## 2017-08-24 DIAGNOSIS — Z0000 Encounter for general adult medical examination without abnormal findings: Secondary | ICD-10-CM | POA: Diagnosis not present

## 2017-08-24 DIAGNOSIS — Z23 Encounter for immunization: Secondary | ICD-10-CM | POA: Diagnosis not present

## 2017-08-24 LAB — LIPID PANEL
Cholesterol: 190 mg/dL (ref 0–200)
HDL: 65.2 mg/dL (ref >39.00)
LDL Cholesterol: 114 mg/dL — ABNORMAL HIGH (ref 0–99)
NonHDL: 124.86
Total CHOL/HDL Ratio: 3
Triglycerides: 55 mg/dL (ref 0.0–149.0)
VLDL: 11 mg/dL (ref 0.0–40.0)

## 2017-08-24 LAB — COMPREHENSIVE METABOLIC PANEL
ALT: 13 U/L (ref 0–35)
AST: 23 U/L (ref 0–37)
Albumin: 3.9 g/dL (ref 3.5–5.2)
Alkaline Phosphatase: 73 U/L (ref 39–117)
BUN: 12 mg/dL (ref 6–23)
CO2: 29 mEq/L (ref 19–32)
Calcium: 9.7 mg/dL (ref 8.4–10.5)
Chloride: 103 mEq/L (ref 96–112)
Creatinine, Ser: 0.69 mg/dL (ref 0.40–1.20)
GFR: 112.66 mL/min (ref >60.00)
Glucose, Bld: 82 mg/dL (ref 70–99)
Potassium: 4.1 mEq/L (ref 3.5–5.1)
Sodium: 139 mEq/L (ref 135–145)
Total Bilirubin: 0.4 mg/dL (ref 0.2–1.2)
Total Protein: 7 g/dL (ref 6.0–8.3)

## 2017-08-24 LAB — CBC
HCT: 39.9 % (ref 36.0–46.0)
Hemoglobin: 13.3 g/dL (ref 12.0–15.0)
MCHC: 33.2 g/dL (ref 30.0–36.0)
MCV: 86.8 fl (ref 78.0–100.0)
Platelets: 358 10*3/uL (ref 150.0–400.0)
RBC: 4.59 Mil/uL (ref 3.87–5.11)
RDW: 14.1 % (ref 11.5–15.5)
WBC: 4.2 10*3/uL (ref 4.0–10.5)

## 2017-08-24 LAB — HEMOGLOBIN A1C: Hgb A1c MFr Bld: 5.7 % (ref 4.6–6.5)

## 2017-08-24 MED ORDER — GLUCOSAMINE-CHONDROITIN 500-400 MG PO TABS: 1.0000 | ORAL_TABLET | Freq: Three times a day (TID) | ORAL | 3 refills | Status: DC

## 2017-08-24 NOTE — Progress Notes (Signed)
   Subjective:    Patient ID: Maria Allen, female    DOB: December 19, 1960, 57 y.o.   MRN: [id number]  HPI The patient is a 57 YO female coming in for wellness. Wants to work on weight.  PMH, [facility], social history reviewed and updated.   Review of Systems  Constitutional: Negative.   HENT: Negative.   Eyes: Negative.   Respiratory: Negative for cough, chest tightness and shortness of breath.   Cardiovascular: Negative for chest pain, palpitations and leg swelling.  Gastrointestinal: Negative for abdominal distention, abdominal pain, constipation, diarrhea, nausea and vomiting.  Musculoskeletal: Negative.   Skin: Negative.   Neurological: Negative.   Psychiatric/Behavioral: Negative.       Objective:   Physical Exam  Constitutional: She is oriented to person, place, and time. She appears well-developed and well-nourished.  Overweight  HENT:  Head: Normocephalic and atraumatic.  Eyes: EOM are normal.  Neck: Normal range of motion.  Cardiovascular: Normal rate and regular rhythm.  Pulmonary/Chest: Effort normal and breath sounds normal. No respiratory distress. She has no wheezes. She has no rales.  Abdominal: Soft. Bowel sounds are normal. She exhibits no distension. There is no tenderness. There is no rebound.  Musculoskeletal: She exhibits no edema.  Neurological: She is alert and oriented to person, place, and time. Coordination normal.  Skin: Skin is warm and dry.  Psychiatric: She has a normal mood and affect.   Vitals:   08/24/17 1447  BP: (!) 140/98  Pulse: 75  Temp: 97.8 F (36.6 C)  TempSrc: Oral  SpO2: 97%  Weight: 251 lb (113.9 kg)  Height: 5' (1.524 m)      Assessment & Plan:  tdap given at visit

## 2017-08-24 NOTE — Patient Instructions (Addendum)
We are checking the labs today.   We are checking the HgA1c which checks the sugars. For this test normal is less than 6. Pre-diabetes is 6-6.5. Diabetes is more than 6.5.  For the cholesterol we are watching the HDL to be more than 45 (good cholesterol). The LDL to be less than 130 (bad cholesterol).   Medicines for weight loss are belviq, contrave, saxenda, orlistat.   Consider reading a book called bright line eating by susan pierce to help explain why we struggle losing weight. The first half of the book has information about weight and weight loss and is good. The second half has diet information and I would not worry about this.   Health Maintenance, Female Adopting a healthy lifestyle and getting preventive care can go a long way to promote health and wellness. Talk with your health care provider about what schedule of regular examinations is right for you. This is a good chance for you to check in with your provider about disease prevention and staying healthy. In between checkups, there are plenty of things you can do on your own. Experts have done a lot of research about which lifestyle changes and preventive measures are most likely to keep you healthy. Ask your health care provider for more information. Weight and diet Eat a healthy diet  Be sure to include plenty of vegetables, fruits, low-fat dairy products, and lean protein.  Do not eat a lot of foods high in solid fats, added sugars, or salt.  Get regular exercise. This is one of the most important things you can do for your health. ? Most adults should exercise for at least 150 minutes each week. The exercise should increase your heart rate and make you sweat (moderate-intensity exercise). ? Most adults should also do strengthening exercises at least twice a week. This is in addition to the moderate-intensity exercise.  Maintain a healthy weight  Body mass index (BMI) is a measurement that can be used to identify possible  weight problems. It estimates body fat based on height and weight. Your health care provider can help determine your BMI and help you achieve or maintain a healthy weight.  For females 42 years of age and older: ? A BMI below 18.5 is considered underweight. ? A BMI of 18.5 to 24.9 is normal. ? A BMI of 25 to 29.9 is considered overweight. ? A BMI of 30 and above is considered obese.  Watch levels of cholesterol and blood lipids  You should start having your blood tested for lipids and cholesterol at 57 years of age, then have this test every 5 years.  You may need to have your cholesterol levels checked more often if: ? Your lipid or cholesterol levels are high. ? You are older than 57 years of age. ? You are at high risk for heart disease.  Cancer screening Lung Cancer  Lung cancer screening is recommended for adults 67-37 years old who are at high risk for lung cancer because of a history of smoking.  A yearly low-dose CT scan of the lungs is recommended for people who: ? Currently smoke. ? Have quit within the past 15 years. ? Have at least a 30-pack-year history of smoking. A pack year is smoking an average of one pack of cigarettes a day for 1 year.  Yearly screening should continue until it has been 15 years since you quit.  Yearly screening should stop if you develop a health problem that would prevent you from  having lung cancer treatment.  Breast Cancer  Practice breast self-awareness. This means understanding how your breasts normally appear and feel.  It also means doing regular breast self-exams. Let your health care provider know about any changes, no matter how small.  If you are in your 20s or 30s, you should have a clinical breast exam (CBE) by a health care provider every 1-3 years as part of a regular health exam.  If you are 51 or older, have a CBE every year. Also consider having a breast X-ray (mammogram) every year.  If you have a family history of  breast cancer, talk to your health care provider about genetic screening.  If you are at high risk for breast cancer, talk to your health care provider about having an MRI and a mammogram every year.  Breast cancer gene (BRCA) assessment is recommended for women who have family members with BRCA-related cancers. BRCA-related cancers include: ? Breast. ? Ovarian. ? Tubal. ? Peritoneal cancers.  Results of the assessment will determine the need for genetic counseling and BRCA1 and BRCA2 testing.  Cervical Cancer Your health care provider may recommend that you be screened regularly for cancer of the pelvic organs (ovaries, uterus, and vagina). This screening involves a pelvic examination, including checking for microscopic changes to the surface of your cervix (Pap test). You may be encouraged to have this screening done every 3 years, beginning at age 41.  For women ages 73-65, health care providers may recommend pelvic exams and Pap testing every 3 years, or they may recommend the Pap and pelvic exam, combined with testing for human papilloma virus (HPV), every 5 years. Some types of HPV increase your risk of cervical cancer. Testing for HPV may also be done on women of any age with unclear Pap test results.  Other health care providers may not recommend any screening for nonpregnant women who are considered low risk for pelvic cancer and who do not have symptoms. Ask your health care provider if a screening pelvic exam is right for you.  If you have had past treatment for cervical cancer or a condition that could lead to cancer, you need Pap tests and screening for cancer for at least 20 years after your treatment. If Pap tests have been discontinued, your risk factors (such as having a new sexual partner) need to be reassessed to determine if screening should resume. Some women have medical problems that increase the chance of getting cervical cancer. In these cases, your health care provider  may recommend more frequent screening and Pap tests.  Colorectal Cancer  This type of cancer can be detected and often prevented.  Routine colorectal cancer screening usually begins at 57 years of age and continues through 57 years of age.  Your health care provider may recommend screening at an earlier age if you have risk factors for colon cancer.  Your health care provider may also recommend using home test kits to check for hidden blood in the stool.  A small camera at the end of a tube can be used to examine your colon directly (sigmoidoscopy or colonoscopy). This is done to check for the earliest forms of colorectal cancer.  Routine screening usually begins at age 3.  Direct examination of the colon should be repeated every 5-10 years through 57 years of age. However, you may need to be screened more often if early forms of precancerous polyps or small growths are found.  Skin Cancer  Check your skin from head  to toe regularly.  Tell your health care provider about any new moles or changes in moles, especially if there is a change in a mole's shape or color.  Also tell your health care provider if you have a mole that is larger than the size of a pencil eraser.  Always use sunscreen. Apply sunscreen liberally and repeatedly throughout the day.  Protect yourself by wearing long sleeves, pants, a wide-brimmed hat, and sunglasses whenever you are outside.  Heart disease, diabetes, and high blood pressure  High blood pressure causes heart disease and increases the risk of stroke. High blood pressure is more likely to develop in: ? People who have blood pressure in the high end of the normal range (130-139/85-89 mm Hg). ? People who are overweight or obese. ? People who are African American.  If you are 70-67 years of age, have your blood pressure checked every 3-5 years. If you are 50 years of age or older, have your blood pressure checked every year. You should have your  blood pressure measured twice-once when you are at a hospital or clinic, and once when you are not at a hospital or clinic. Record the average of the two measurements. To check your blood pressure when you are not at a hospital or clinic, you can use: ? An automated blood pressure machine at a pharmacy. ? A home blood pressure monitor.  If you are between 32 years and 41 years old, ask your health care provider if you should take aspirin to prevent strokes.  Have regular diabetes screenings. This involves taking a blood sample to check your fasting blood sugar level. ? If you are at a normal weight and have a low risk for diabetes, have this test once every three years after 57 years of age. ? If you are overweight and have a high risk for diabetes, consider being tested at a younger age or more often. Preventing infection Hepatitis B  If you have a higher risk for hepatitis B, you should be screened for this virus. You are considered at high risk for hepatitis B if: ? You were born in a country where hepatitis B is common. Ask your health care provider which countries are considered high risk. ? Your parents were born in a high-risk country, and you have not been immunized against hepatitis B (hepatitis B vaccine). ? You have HIV or AIDS. ? You use needles to inject street drugs. ? You live with someone who has hepatitis B. ? You have had sex with someone who has hepatitis B. ? You get hemodialysis treatment. ? You take certain medicines for conditions, including cancer, organ transplantation, and autoimmune conditions.  Hepatitis C  Blood testing is recommended for: ? Everyone born from [birth year] through 1965. ? Anyone with known risk factors for hepatitis C.  Sexually transmitted infections (STIs)  You should be screened for sexually transmitted infections (STIs) including gonorrhea and chlamydia if: ? You are sexually active and are younger than 57 years of age. ? You are older than 57  years of age and your health care provider tells you that you are at risk for this type of infection. ? Your sexual activity has changed since you were last screened and you are at an increased risk for chlamydia or gonorrhea. Ask your health care provider if you are at risk.  If you do not have HIV, but are at risk, it may be recommended that you take a prescription medicine daily to prevent HIV  infection. This is called pre-exposure prophylaxis (PrEP). You are considered at risk if: ? You are sexually active and do not regularly use condoms or know the HIV status of your partner(s). ? You take drugs by injection. ? You are sexually active with a partner who has HIV.  Talk with your health care provider about whether you are at high risk of being infected with HIV. If you choose to begin PrEP, you should first be tested for HIV. You should then be tested every 3 months for as long as you are taking PrEP. Pregnancy  If you are premenopausal and you may become pregnant, ask your health care provider about preconception counseling.  If you may become pregnant, take 400 to 800 micrograms (mcg) of folic acid every day.  If you want to prevent pregnancy, talk to your health care provider about birth control (contraception). Osteoporosis and menopause  Osteoporosis is a disease in which the bones lose minerals and strength with aging. This can result in serious bone fractures. Your risk for osteoporosis can be identified using a bone density scan.  If you are 41 years of age or older, or if you are at risk for osteoporosis and fractures, ask your health care provider if you should be screened.  Ask your health care provider whether you should take a calcium or vitamin D supplement to lower your risk for osteoporosis.  Menopause may have certain physical symptoms and risks.  Hormone replacement therapy may reduce some of these symptoms and risks. Talk to your health care provider about whether  hormone replacement therapy is right for you. Follow these instructions at home:  Schedule regular health, dental, and eye exams.  Stay current with your immunizations.  Do not use any tobacco products including cigarettes, chewing tobacco, or electronic cigarettes.  If you are pregnant, do not drink alcohol.  If you are breastfeeding, limit how much and how often you drink alcohol.  Limit alcohol intake to no more than 1 drink per day for nonpregnant women. One drink equals 12 ounces of beer, 5 ounces of wine, or 1 ounces of hard liquor.  Do not use street drugs.  Do not share needles.  Ask your health care provider for help if you need support or information about quitting drugs.  Tell your health care provider if you often feel depressed.  Tell your health care provider if you have ever been abused or do not feel safe at home. This information is not intended to replace advice given to you by your health care provider. Make sure you discuss any questions you have with your health care provider. Document Released: 11/04/2010 Document Revised: 09/27/2015 Document Reviewed: 01/23/2015 Elsevier Interactive Patient Education  [employer].

## 2017-08-25 ENCOUNTER — Ambulatory Visit
Admission: RE | Admit: 2017-08-25 | Discharge: 2017-08-25 | Disposition: A | Discharge status: Home / Self Care | Payer: BLUE CROSS/BLUE SHIELD | Source: Ambulatory Visit | Attending: General Surgery | Admitting: General Surgery

## 2017-08-25 ENCOUNTER — Telehealth: Payer: Self-pay

## 2017-08-25 DIAGNOSIS — Z853 Personal history of malignant neoplasm of breast: Secondary | ICD-10-CM

## 2017-08-25 NOTE — Telephone Encounter (Signed)
Copied from [place] [id number]. Topic: Referral - Request >> Aug 25, 2017  8:30 AM Aurelio Brash B wrote: Reason for CRM: Pt is requesting referral to eye dr. Because reading glasses not helping much more,  headaches, vision  -   Dr Frederico Hamman at [facility] on [address]

## 2017-08-25 NOTE — Telephone Encounter (Signed)
Patient informed that they do not need a referral to see the eye doctor that they can just call and make an appointment. Patient stated understanding

## 2017-08-25 NOTE — Telephone Encounter (Signed)
Copied from [place] [id number]. Topic: General - Other >> Aug 25, 2017  8:32 AM Aurelio Brash B wrote: Reason for CRM: PT called to let Dr Sharlet Salina know she is not interested in the weight loss  medications   that she gave her due to side effects.

## 2017-08-25 NOTE — Telephone Encounter (Signed)
FYI Patient states that she looked into the weight loss medications and decided she did not want to start any due to the side effects that she saw.

## 2017-08-26 NOTE — Assessment & Plan Note (Addendum)
Tdap given at visit, pap and mammogram up to date. Colonoscopy up to date. Reminded about yearly flu shot. Declines HIV screening. Counseled about weight loss and diet management. Given screening recommendations.

## 2017-08-26 NOTE — Assessment & Plan Note (Signed)
Weight is stable from last year. She has done phentermine with gyn and I have recommended to stop that with mildly elevated BP. Given names of alternative weight loss options to consider.

## 2017-10-26 ENCOUNTER — Encounter: Payer: Self-pay | Admitting: Nurse Practitioner

## 2017-10-26 ENCOUNTER — Ambulatory Visit: Payer: BLUE CROSS/BLUE SHIELD | Admitting: Nurse Practitioner

## 2017-10-26 ENCOUNTER — Ambulatory Visit (INDEPENDENT_AMBULATORY_CARE_PROVIDER_SITE_OTHER)
Admission: RE | Admit: 2017-10-26 | Discharge: 2017-10-26 | Disposition: A | Discharge status: Home / Self Care | Payer: BLUE CROSS/BLUE SHIELD | Source: Ambulatory Visit | Attending: Nurse Practitioner | Admitting: Nurse Practitioner

## 2017-10-26 VITALS — BP 154/88 | HR 101 | Temp 98.3°F | Resp 16 | Ht 60.0 in | Wt 256.0 lb

## 2017-10-26 DIAGNOSIS — M25562 Pain in left knee: Secondary | ICD-10-CM | POA: Diagnosis not present

## 2017-10-26 MED ORDER — MELOXICAM 7.5 MG PO TABS: 7.5000 mg | ORAL_TABLET | Freq: Every day | ORAL | 0 refills | Status: DC

## 2017-10-26 NOTE — Progress Notes (Signed)
Name: Maria Allen   MRN: [id number]    DOB: 1961/01/03   Date:10/26/2017       Progress Note  Subjective  Chief Complaint  Chief Complaint  Patient presents with  . Knee Pain    left knee pain that has been going on since friday has gotten worse since then    HPI Maria Allen is here today for evaluation of acute left knee pain. The pain began about 2 weeks ago after she tried to kick something off a rug, her foot got stuck on the rug during the kick and she felt a "twinge" of pain in her knee. The pain seemed to improve until 1 weeks ago when she went to the salon for a pedicure and had a massage and hot towel on her left leg. After that she began to notice increasing pain, soreness to her left knee. The pain increases with ambulation. She denies fevers, swelling, erythema, ecchymosis. She has been taking aleve prn,applying heat and cold with minimal pain relief. The knee feels slightly better today than yesterday.   Patient Active Problem List   Diagnosis Date Noted  . Breast cancer of upper-inner quadrant of left female breast ([place]) 08/29/2015  . Routine general medical examination at a health care facility 08/13/2015  . Morbid obesity ([place]) 08/03/2014    Social History   Tobacco Use  . Smoking status: Never Smoker  . Smokeless tobacco: Never Used  Substance Use Topics  . Alcohol use: No    Alcohol/week: 0.0 oz     Current Outpatient Medications:  .  anastrozole (ARIMIDEX) 1 MG tablet, Take 1 tablet (1 mg total) by mouth daily. Please give 90 tabs, Disp: 90 tablet, Rfl: 2 .  aspirin 325 MG tablet, Take 650 mg by mouth daily as needed for moderate pain or headache. Reported on 09/05/2015, Disp: , Rfl:  .  calcium gluconate 500 MG tablet, Take 1 tablet by mouth 2 (two) times daily., Disp: , Rfl:  .  cholecalciferol (VITAMIN D) 1000 units tablet, Take 1,000 Units by mouth daily., Disp: , Rfl:  .  docusate sodium (STOOL SOFTENER) 100 MG capsule, Stool Softener, Disp: , Rfl:  .   Ginkgo Biloba 60 MG CAPS, ginkgo biloba, Disp: , Rfl:  .  glucosamine-chondroitin 500-400 MG tablet, Take 1 tablet by mouth 3 (three) times daily., Disp: 270 tablet, Rfl: 3 .  hyaluronate sodium (RADIAPLEXRX) GEL, Apply 1 application topically once., Disp: , Rfl:  .  non-metallic deodorant (ALRA) MISC, Apply 1 application topically daily as needed., Disp: , Rfl:  .  Omega-3 Fatty Acids (FISH OIL) 1000 MG CAPS, Fish Oil, Disp: , Rfl:  .  phentermine 37.5 MG capsule, Take 37.5 mg by mouth every morning., Disp: , Rfl:  .  TURMERIC PO, Take 1 tablet by mouth., Disp: , Rfl:  .  vitamin C (ASCORBIC ACID) 250 MG tablet, Take 1,000 mg by mouth 2 (two) times daily. , Disp: , Rfl:   Allergies  Allergen Reactions  . Gadolinium Derivatives Nausea Only    Contrast agent    ROS See HPI  Objective  Vitals:   10/26/17 1448  BP: (!) 154/88  Pulse: (!) 101  Resp: 16  Temp: 98.3 F (36.8 C)  TempSrc: Oral  SpO2: 98%  Weight: 256 lb (116.1 kg)  Height: 5' (1.524 m)  BP slightly elevated but she is here for acute pain which is likely contributory  Body mass index is 50 kg/m.  Nursing Note and Vital  Signs reviewed.  Physical Exam  Constitutional: Patient appears well-developed and well-nourished.  No distress.  HEENT: head atraumatic, normocephalic, pupils equal and reactive to light, EOM's intact, neck supple, oropharynx pink and moist without exudate Cardiovascular: Normal rate, regular rhythm. No BLE edema. Distal pulses intact. Pulmonary/Chest: Effort normal , No respiratory distress or retractions. Musculoskeletal: Normal range of motion, no joint effusions. No gross deformities. Left knee without swelling, erythema or tenderness Neurological: She is alert and oriented to person, place, and time. No cranial nerve deficit. Coordination, balance, strength, speech and gait are normal.  Skin: Skin is warm and dry. No rash noted. No erythema.  Psychiatric: Patient has a normal mood and  affect. behavior is normal. Judgment and thought content normal.   Assessment & Plan  1. Acute pain of left knee Will rx short course of mobic-dosing and side effects discussed Discussed home management of knee pain,Red flags and when to present for emergency care or RTC including fever >101.70F, chest pain, shortness of breath, new/worsening/un-resolving symptoms, during visit. Follow up and care instructions discussed and provided in AVS. F/U with further recommendations pending knee xray results - DG Knee Complete 4 Views Left; Future - meloxicam (MOBIC) 7.5 MG tablet; Take 1 tablet (7.5 mg total) by mouth daily.  Dispense: 10 tablet; Refill: 0

## 2017-10-26 NOTE — Patient Instructions (Signed)
I have sent a prescription for mobic 7.5 mg once daily to your pharmacy. Do not take this with aleve or other anit-inflammatory medications  Please head downstairs for x-rays. If any of your test results are critically abnormal, you will be contacted right away. Otherwise, I will contact you within a week about your test results and any recommendations for abnormalities.  We will determine what to do next when I get your x-ray back.   Knee Pain, Adult Many things can cause knee pain. The pain often goes away on its own with time and rest. If the pain does not go away, tests may be done to find out what is causing the pain. Follow these instructions at home: Activity  Rest your knee.  Do not do things that cause pain.  Avoid activities where both feet leave the ground at the same time (high-impact activities). Examples are running, jumping rope, and doing jumping jacks. General instructions  Take medicines only as told by your doctor.  Raise (elevate) your knee when you are resting. Make sure your knee is higher than your heart.  Sleep with a pillow under your knee.  If told, put ice on the knee: ? Put ice in a plastic bag. ? Place a towel between your skin and the bag. ? Leave the ice on for 20 minutes, 2-3 times a day.  Ask your doctor if you should wear an elastic knee support.  Lose weight if you are overweight. Being overweight can make your knee hurt more.  Do not use any tobacco products. These include cigarettes, chewing tobacco, or electronic cigarettes. If you need help quitting, ask your doctor. Smoking may slow down healing. Contact a doctor if:  The pain does not stop.  The pain changes or gets worse.  You have a fever along with knee pain.  Your knee gives out or locks up.  Your knee swells, and becomes worse. Get help right away if:  Your knee feels warm.  You cannot move your knee.  You have very bad knee pain.  You have chest pain.  You have  trouble breathing. Summary  Many things can cause knee pain. The pain often goes away on its own with time and rest.  Avoid activities that put stress on your knee. These include running and jumping rope.  Get help right away if you cannot move your knee, or if your knee feels warm, or if you have trouble breathing. This information is not intended to replace advice given to you by your health care provider. Make sure you discuss any questions you have with your health care provider. Document Released: 07/18/2008 Document Revised: 04/15/2016 Document Reviewed: 04/15/2016 Elsevier Interactive Patient Education  2017 [employer].

## 2017-11-16 ENCOUNTER — Telehealth: Payer: Self-pay | Admitting: Hematology and Oncology

## 2017-11-16 ENCOUNTER — Inpatient Hospital Stay: Payer: Self-pay | Attending: Hematology and Oncology | Admitting: Hematology and Oncology

## 2017-11-16 DIAGNOSIS — Z79811 Long term (current) use of aromatase inhibitors: Secondary | ICD-10-CM | POA: Insufficient documentation

## 2017-11-16 DIAGNOSIS — Z923 Personal history of irradiation: Secondary | ICD-10-CM | POA: Insufficient documentation

## 2017-11-16 DIAGNOSIS — M791 Myalgia, unspecified site: Secondary | ICD-10-CM | POA: Insufficient documentation

## 2017-11-16 DIAGNOSIS — R232 Flushing: Secondary | ICD-10-CM | POA: Insufficient documentation

## 2017-11-16 DIAGNOSIS — Z79899 Other long term (current) drug therapy: Secondary | ICD-10-CM | POA: Insufficient documentation

## 2017-11-16 DIAGNOSIS — I10 Essential (primary) hypertension: Secondary | ICD-10-CM | POA: Insufficient documentation

## 2017-11-16 DIAGNOSIS — Z7982 Long term (current) use of aspirin: Secondary | ICD-10-CM | POA: Insufficient documentation

## 2017-11-16 DIAGNOSIS — Z170 Estrogen receptor positive status [ER+]: Secondary | ICD-10-CM | POA: Insufficient documentation

## 2017-11-16 DIAGNOSIS — C50212 Malignant neoplasm of upper-inner quadrant of left female breast: Secondary | ICD-10-CM | POA: Insufficient documentation

## 2017-11-16 DIAGNOSIS — R5383 Other fatigue: Secondary | ICD-10-CM | POA: Insufficient documentation

## 2017-11-16 MED ORDER — ANASTROZOLE 1 MG PO TABS: 1.0000 mg | ORAL_TABLET | Freq: Every day | ORAL | 3 refills | Status: DC

## 2017-11-16 NOTE — Assessment & Plan Note (Signed)
Left lumpectomy: Invasive lobular carcinoma grade 1, 0.9 cm, ALH, margins negative, 0/3 lymph nodes negative, T1bN0 stage IA, ER 80%, PR 95%, HER-2 negative ratio 0.97, Ki-67 10% Oncotype DX score 21, 14% risk of recurrence intermediate risk  Adjuvant radiation therapy from 11/21/2015 to 01/10/2016  Current treatment: Antiestrogen therapy with anastrozole 1 mg by mouth daily 5 years started October 2017  Anastrozole toxicities: 1. Intermittent hot flashes 2. muscle aches and pains that are manageable  Instructed the patient to take it at bedtime She is taking tumeric We talked about tonic water   Breast cancer surveillance 1.  Breast exams on 15 2019: Benign 2. mammogram 08/25/2017: No evidence of malignancy breast density category C  Return to clinic in 1 year for follow-up  Breast cancer surveillance:

## 2017-11-16 NOTE — Progress Notes (Signed)
Patient Care Team: Hoyt Koch, MD as PCP - General (Internal Medicine)  DIAGNOSIS:  Encounter Diagnosis  Name Primary?  . Malignant neoplasm of upper-inner quadrant of left breast in female, estrogen receptor positive ([place])     SUMMARY OF ONCOLOGIC HISTORY:   Breast cancer of upper-inner quadrant of left female breast ([place])   08/27/2015 Initial Diagnosis    Left breast biopsy 11:00: Invasive lobular cancer with LCIS, grade 1,ER 80%, PR 95%, Ki-67 10%, HER-2 Negativeratio 0.97, T1 BN 0 stage I a clinical stage      08/27/2015 Mammogram    Screening detected left breast asymmetry posteriorly at 11:00 position 9 x 8 x 5 mm by ultrasound, axilla negative      10/09/2015 Surgery    Left lumpectomy Barry Dienes): Invasive lobular carcinoma grade 1, 0.9 cm, ALH, margins negative, 0/3 lymph nodes negative, T1bN0 stage IA, ER 80%, PR 95%, HER-2 negative ratio 0.97, Ki-67 10%, Oncotype DX 21, 14% ROR      10/09/2015 Oncotype testing    Recurrence score: 21; 14% ROR (intermediate-risk)       11/21/2015 - 01/10/2016 Radiation Therapy    Adjuvant radiation therapy Lisbeth Renshaw). Left breast: 50.4 Gy in 28 fractions. Left breast boost: 10 Gy in 5 fractions       02/2016 -  Anti-estrogen oral therapy    Anastrozole 1 mg daily. Planned duration of therapy: 5 years        CHIEF COMPLIANT: Follow-up on anastrozole therapy, complaining of knee problems  INTERVAL HISTORY: Maria Allen is a 57 year old with above-mentioned history of left breast cancer currently on antiestrogen therapy with anastrozole.  She nearly completed 2 years of therapy.  Recently she twisted her knee and has been having knee problems.  She appears to be tolerating anastrozole extremely well.  She does have occasional hot flashes and occasional muscle aches and pains.  Her blood pressure today is been very elevated.  Apparently she is being watched for high blood pressure.  REVIEW OF SYSTEMS:   Constitutional: Denies fevers,  chills or abnormal weight loss Eyes: Denies blurriness of vision Ears, nose, mouth, throat, and face: Denies mucositis or sore throat Respiratory: Denies cough, dyspnea or wheezes Cardiovascular: Denies palpitation, chest discomfort Gastrointestinal:  Denies nausea, heartburn or change in bowel habits Skin: Denies abnormal skin rashes Lymphatics: Denies new lymphadenopathy or easy bruising Neurological:Denies numbness, tingling or new weaknesses Behavioral/Psych: Mood is stable, no new changes  Extremities: No lower extremity edema Breast:  denies any pain or lumps or nodules in either breasts All other systems were reviewed with the patient and are negative.  I have reviewed the past medical history, past surgical history, social history and family history with the patient and they are unchanged from previous note.  ALLERGIES:  is allergic to gadolinium derivatives.  MEDICATIONS:  Current Outpatient Medications  Medication Sig Dispense Refill  . anastrozole (ARIMIDEX) 1 MG tablet Take 1 tablet (1 mg total) by mouth daily. Please give 90 tabs 90 tablet 2  . aspirin 325 MG tablet Take 650 mg by mouth daily as needed for moderate pain or headache. Reported on 09/05/2015    . calcium gluconate 500 MG tablet Take 1 tablet by mouth 2 (two) times daily.    . cholecalciferol (VITAMIN D) 1000 units tablet Take 1,000 Units by mouth daily.    Marland Kitchen docusate sodium (STOOL SOFTENER) 100 MG capsule Stool Softener    . Ginkgo Biloba 60 MG CAPS ginkgo biloba    .  glucosamine-chondroitin 500-400 MG tablet Take 1 tablet by mouth 3 (three) times daily. 270 tablet 3  . hyaluronate sodium (RADIAPLEXRX) GEL Apply 1 application topically once.    . meloxicam (MOBIC) 7.5 MG tablet Take 1 tablet (7.5 mg total) by mouth daily. 10 tablet 0  . non-metallic deodorant (ALRA) MISC Apply 1 application topically daily as needed.    . Omega-3 Fatty Acids (FISH OIL) 1000 MG CAPS Fish Oil    . phentermine 37.5 MG capsule  Take 37.5 mg by mouth every morning.    . TURMERIC PO Take 1 tablet by mouth.    . vitamin C (ASCORBIC ACID) 250 MG tablet Take 1,000 mg by mouth 2 (two) times daily.      No current facility-administered medications for this visit.     PHYSICAL EXAMINATION: ECOG PERFORMANCE STATUS: 1 - Symptomatic but completely ambulatory  Vitals:   11/16/17 0932  BP: (!) 117/114  Pulse: 76  Resp: 18  Temp: 98.8 F (37.1 C)  SpO2: 100%   Filed Weights   11/16/17 0932  Weight: 254 lb 11.2 oz (115.5 kg)    GENERAL:alert, no distress and comfortable SKIN: skin color, texture, turgor are normal, no rashes or significant lesions EYES: normal, Conjunctiva are pink and non-injected, sclera clear OROPHARYNX:no exudate, no erythema and lips, buccal mucosa, and tongue normal  NECK: supple, thyroid normal size, non-tender, without nodularity LYMPH:  no palpable lymphadenopathy in the cervical, axillary or inguinal LUNGS: clear to auscultation and percussion with normal breathing effort HEART: regular rate & rhythm and no murmurs and no lower extremity edema ABDOMEN:abdomen soft, non-tender and normal bowel sounds MUSCULOSKELETAL:no cyanosis of digits and no clubbing  NEURO: alert & oriented x 3 with fluent speech, no focal motor/sensory deficits EXTREMITIES: No lower extremity edema BREAST: No palpable masses or nodules in either right or left breasts. No palpable axillary supraclavicular or infraclavicular adenopathy no breast tenderness or nipple discharge. (exam performed in the presence of a chaperone)  LABORATORY DATA:  I have reviewed the data as listed CMP Latest Ref Rng & Units 08/24/2017 08/19/2016 09/05/2015  Glucose 70 - 99 mg/dL 82 81 75  BUN 6 - 23 mg/dL 12 14 16.6  Creatinine 0.40 - 1.20 mg/dL 0.69 0.81 0.9  Sodium 135 - 145 mEq/L 139 139 139  Potassium 3.5 - 5.1 mEq/L 4.1 3.8 4.2  Chloride 96 - 112 mEq/L 103 105 -  CO2 19 - 32 mEq/L _0 Calcium 8.4 - 10.5 mg/dL 9.7 10.1 10.2    Total Protein 6.0 - 8.3 g/dL 7.0 7.1 7.6  Total Bilirubin 0.2 - 1.2 mg/dL 0.4 0.4 0.51  Alkaline Phos 39 - 117 U/L 73 64 58  AST 0 - 37 U/L _1 ALT 0 - 35 U/L _2 Lab Results  Component Value Date   WBC 4.2 08/24/2017   HGB 13.3 08/24/2017   HCT 39.9 08/24/2017   MCV 86.8 08/24/2017   PLT 358.0 08/24/2017   NEUTROABS 2.2 09/05/2015    ASSESSMENT & PLAN:  Breast cancer of upper-inner quadrant of left female breast ([place]) Left lumpectomy: Invasive lobular carcinoma grade 1, 0.9 cm, ALH, margins negative, 0/3 lymph nodes negative, T1bN0 stage IA, ER 80%, PR 95%, HER-2 negative ratio 0.97, Ki-67 10% Oncotype DX score 21, 14% risk of recurrence intermediate risk  Adjuvant radiation therapy from 11/21/2015 to 01/10/2016  Current treatment: Antiestrogen therapy with anastrozole 1 mg by mouth daily 5 years started October  2017  Anastrozole toxicities: 1. Intermittent hot flashes 2. muscle aches and pains that are manageable  Hypertension: I instructed her to follow with her primary care physician.  We will recheck her blood pressure before sending her home.  Knee pain: From the recent sprain. Lack of energy: She told me that since phentermine was stopped she is feeling more fatigued.  Breast cancer surveillance 1.  Breast exams on 15 2019: Benign 2. mammogram 08/25/2017: No evidence of malignancy breast density category C  Return to clinic in 1 year for follow-up  Breast cancer surveillance:    No orders of the defined types were placed in this encounter.  The patient has a good understanding of the overall plan. she agrees with it. she will call with any problems that may develop before the next visit here.   Harriette Ohara, MD 11/16/17

## 2017-11-16 NOTE — Telephone Encounter (Signed)
Gave patient AVS and Calendar.  °

## 2017-12-16 ENCOUNTER — Encounter: Payer: Self-pay | Admitting: Hematology and Oncology

## 2017-12-16 NOTE — Progress Notes (Signed)
Received referral from RN regarding assistance with Anastrozole for uninsured patient.  Called patient to introduce myself as Arboriculturist and to provide her with information to [employer] to assist with medication cost. Advised her that she would have to provide income information an form of payment. Gave patient the contact number for them to determine eligibility based on these requirements. She verbalized understanding and asked about the samples. I referred her back to RN.  Went and discussed with RN whom will be providing some samples for her and will be contacting the patient.

## 2018-11-15 ENCOUNTER — Telehealth: Payer: Self-pay | Admitting: Hematology and Oncology

## 2018-11-15 NOTE — Telephone Encounter (Signed)
I left a message regarding visit °

## 2018-11-15 NOTE — Assessment & Plan Note (Signed)
Left lumpectomy: Invasive lobular carcinoma grade 1, 0.9 cm, ALH, margins negative, 0/3 lymph nodes negative, T1bN0 stage IA, ER 80%, PR 95%, HER-2 negative ratio 0.97, Ki-67 10% Oncotype DX score 21, 14% risk of recurrence intermediate risk  Adjuvant radiation therapy from 11/21/2015 to 01/10/2016  Current treatment: Antiestrogen therapy with anastrozole 1 mg by mouth daily 5 yearsstarted October 2017  Anastrozole toxicities: 1. Intermittent hot flashes 2. muscle aches and pains that are manageable  Hypertension: I instructed her to follow with her primary care physician.  We will recheck her blood pressure before sending her home. Knee pain: From the recent sprain. Lack of energy: She told me that since phentermine was stopped she is feeling more fatigued.  Breast cancer surveillance 1.  Breast exams : Benign 2. mammogram 08/25/2017: No evidence of malignancy breast density category C  Return to clinic in1 yearfor follow-up 

## 2018-11-19 ENCOUNTER — Telehealth: Payer: Self-pay | Admitting: Hematology and Oncology

## 2018-11-19 NOTE — Progress Notes (Signed)
I was unable to connect with the patient on the phone. I left a message for the patient to call us back to reschedule her appointment. This encounter was created in error - please disregard.

## 2018-11-22 ENCOUNTER — Inpatient Hospital Stay: Payer: Self-pay | Attending: Hematology and Oncology | Admitting: Hematology and Oncology

## 2018-11-22 DIAGNOSIS — C50212 Malignant neoplasm of upper-inner quadrant of left female breast: Secondary | ICD-10-CM

## 2018-11-22 DIAGNOSIS — Z170 Estrogen receptor positive status [ER+]: Secondary | ICD-10-CM

## 2019-06-27 ENCOUNTER — Ambulatory Visit: Payer: Self-pay | Attending: Family

## 2019-06-27 DIAGNOSIS — Z23 Encounter for immunization: Secondary | ICD-10-CM | POA: Insufficient documentation

## 2019-06-27 NOTE — Progress Notes (Signed)
   Covid-19 Vaccination Clinic  Name:  Maria Allen    MRN: [id number] DOB: 12/07/1960  06/27/2019  [name] was observed post Covid-19 immunization for 15 minutes without incidence. She was provided with Vaccine Information Sheet and instruction to access the V-Safe system.   [name] was instructed to call 911 with any severe reactions post vaccine: Marland Kitchen Difficulty breathing  . Swelling of your face and throat  . A fast heartbeat  . A bad rash all over your body  . Dizziness and weakness    Immunizations Administered    Name Date Dose VIS Date Route   Moderna COVID-19 Vaccine 06/27/2019  2:24 PM 0.5 mL 04/05/2019 Intramuscular   Manufacturer: Moderna   Lot: [id number]   [id number]

## 2019-08-02 ENCOUNTER — Ambulatory Visit: Payer: Self-pay | Attending: Family

## 2019-08-02 DIAGNOSIS — Z23 Encounter for immunization: Secondary | ICD-10-CM

## 2019-08-02 NOTE — Progress Notes (Signed)
   Covid-19 Vaccination Clinic  Name:  Maria Allen    MRN: [id number] DOB: 1960-07-17  08/02/2019  [name] was observed post Covid-19 immunization for 15 minutes without incident. She was provided with Vaccine Information Sheet and instruction to access the V-Safe system.   [name] was instructed to call 911 with any severe reactions post vaccine: Marland Kitchen Difficulty breathing  . Swelling of face and throat  . A fast heartbeat  . A bad rash all over body  . Dizziness and weakness   Immunizations Administered    Name Date Dose VIS Date Route   Moderna COVID-19 Vaccine 08/02/2019  2:29 PM 0.5 mL 04/05/2019 Intramuscular   Manufacturer: Moderna   Lot: [id number]   [id number]

## 2020-03-26 ENCOUNTER — Ambulatory Visit: Payer: Self-pay | Attending: Internal Medicine

## 2020-03-26 DIAGNOSIS — Z23 Encounter for immunization: Secondary | ICD-10-CM

## 2020-03-26 NOTE — Progress Notes (Signed)
   Covid-19 Vaccination Clinic  Name:  TRENITY PHA    MRN: [id number] DOB: Oct 22, 1960  03/26/2020  Ms. Hirt was observed post Covid-19 immunization for 15 minutes without incident. She was provided with Vaccine Information Sheet and instruction to access the V-Safe system.   Ms. Gotcher was instructed to call 911 with any severe reactions post vaccine: Marland Kitchen Difficulty breathing  . Swelling of face and throat  . A fast heartbeat  . A bad rash all over body  . Dizziness and weakness   Immunizations Administered    No immunizations on file.

## 2020-09-20 ENCOUNTER — Telehealth: Payer: Self-pay | Admitting: Internal Medicine

## 2020-09-20 ENCOUNTER — Other Ambulatory Visit: Payer: Self-pay | Admitting: Hematology and Oncology

## 2020-09-20 DIAGNOSIS — Z1231 Encounter for screening mammogram for malignant neoplasm of breast: Secondary | ICD-10-CM

## 2020-09-20 NOTE — Telephone Encounter (Signed)
Patient hasn't been seen since 04.22.19, she is trying to make an appointment for a physical. Is it okay if I go ahead or since its been three years will she need a re-establish care visit?

## 2020-09-21 NOTE — Telephone Encounter (Signed)
This would be new patient apt

## 2020-09-21 NOTE — Telephone Encounter (Signed)
LVM for patient to call back to make appointment

## 2020-10-25 ENCOUNTER — Other Ambulatory Visit: Payer: Self-pay | Admitting: Hematology and Oncology

## 2020-10-25 DIAGNOSIS — Z853 Personal history of malignant neoplasm of breast: Secondary | ICD-10-CM

## 2020-10-30 ENCOUNTER — Other Ambulatory Visit: Payer: Self-pay | Admitting: Hematology and Oncology

## 2020-10-30 ENCOUNTER — Ambulatory Visit
Admission: RE | Admit: 2020-10-30 | Discharge: 2020-10-30 | Disposition: A | Discharge status: Home / Self Care | Payer: 59 | Source: Ambulatory Visit | Attending: Hematology and Oncology | Admitting: Hematology and Oncology

## 2020-10-30 ENCOUNTER — Other Ambulatory Visit: Payer: Self-pay

## 2020-10-30 DIAGNOSIS — Z853 Personal history of malignant neoplasm of breast: Secondary | ICD-10-CM

## 2021-01-14 ENCOUNTER — Other Ambulatory Visit: Payer: Self-pay | Admitting: *Deleted

## 2021-01-14 DIAGNOSIS — C50212 Malignant neoplasm of upper-inner quadrant of left female breast: Secondary | ICD-10-CM

## 2021-01-14 NOTE — Progress Notes (Signed)
Patient Care Team: Maria Koch, MD as PCP - General (Internal Medicine)  DIAGNOSIS:    ICD-10-CM   1. Malignant neoplasm of upper-inner quadrant of left breast in female, estrogen receptor positive ([place])  C50.212    Z17.0       SUMMARY OF ONCOLOGIC HISTORY: Oncology History  Breast cancer of upper-inner quadrant of left female breast ([place])  08/27/2015 Initial Diagnosis   Left breast biopsy 11:00: Invasive lobular cancer with LCIS, grade 1,ER 80%, PR 95%, Ki-67 10%, HER-2 Negativeratio 0.97, T1 BN 0 stage I a clinical stage   08/27/2015 Mammogram   Screening detected left breast asymmetry posteriorly at 11:00 position 9 x 8 x 5 mm by ultrasound, axilla negative   10/09/2015 Surgery   Left lumpectomy Barry Dienes): Invasive lobular carcinoma grade 1, 0.9 cm, ALH, margins negative, 0/3 lymph nodes negative, T1bN0 stage IA, ER 80%, PR 95%, HER-2 negative ratio 0.97, Ki-67 10%, Oncotype DX 21, 14% ROR   10/09/2015 Oncotype testing   Recurrence score: 21; 14% ROR (intermediate-risk)    11/21/2015 - 01/10/2016 Radiation Therapy   Adjuvant radiation therapy Lisbeth Renshaw). Left breast: 50.4 Gy in 28 fractions. Left breast boost: 10 Gy in 5 fractions    02/2016 -  Anti-estrogen oral therapy   Anastrozole 1 mg daily.      CHIEF COMPLIANT: Patient was lost to follow-up for 2 years.  INTERVAL HISTORY: Maria Allen is a 60 y.o. with above-mentioned history of left breast cancer currently on antiestrogen therapy with anastrozole. Mammogram on 10/30/2020 She presents to the clinic today for follow-up.  She quit anastrozole therapy about 2 years ago when her insurance changed.  Finally her insurance changed back and she is back to following with [place].  She had a recent mammogram in June which was normal.  She tells me that when she took anastrozole she felt with muscle aches and pains as well as hot flashes and that was another reason for stopping it.  ALLERGIES:  is allergic to gadolinium  derivatives.  MEDICATIONS:  Current Outpatient Medications  Medication Sig Dispense Refill   Specialty Vitamins Products (ADVANCED COLLAGEN) TABS Take 1 tablet by mouth daily.     vitamin B-12 (CYANOCOBALAMIN) 1000 MCG tablet Take 1 tablet (1,000 mcg total) by mouth daily.     zinc gluconate 50 MG tablet Take 1 tablet (50 mg total) by mouth daily.     anastrozole (ARIMIDEX) 1 MG tablet Take 1 tablet (1 mg total) by mouth daily. Please give 90 tabs 90 tablet 1   aspirin 325 MG tablet Take 650 mg by mouth daily as needed for moderate pain or headache. Reported on 09/05/2015     calcium gluconate 500 MG tablet Take 1 tablet by mouth 2 (two) times daily.     cholecalciferol (VITAMIN D) 1000 units tablet Take 1,000 Units by mouth daily.     docusate sodium (STOOL SOFTENER) 100 MG capsule Stool Softener     Ginkgo Biloba 60 MG CAPS ginkgo biloba     glucosamine-chondroitin 500-400 MG tablet Take 1 tablet by mouth 3 (three) times daily. 270 tablet 3   hyaluronate sodium (RADIAPLEXRX) GEL Apply 1 application topically once.     non-metallic deodorant (ALRA) MISC Apply 1 application topically daily as needed.     TURMERIC PO Take 1 tablet by mouth.     vitamin C (ASCORBIC ACID) 250 MG tablet Take 1,000 mg by mouth 2 (two) times daily.      No current facility-administered medications  for this visit.    PHYSICAL EXAMINATION: ECOG PERFORMANCE STATUS: 1 - Symptomatic but completely ambulatory  Vitals:   01/15/21 1456  BP: (!) 185/80  Pulse: 77  Resp: 19  Temp: (!) 97.5 F (36.4 C)  SpO2: 99%   Filed Weights   01/15/21 1456  Weight: 257 lb (116.6 kg)      LABORATORY DATA:  I have reviewed the data as listed CMP Latest Ref Rng & Units 08/24/2017 08/19/2016 09/05/2015  Glucose 70 - 99 mg/dL 82 81 75  BUN 6 - 23 mg/dL 12 14 16.6  Creatinine 0.40 - 1.20 mg/dL 0.69 0.81 0.9  Sodium 135 - 145 mEq/L 139 139 139  Potassium 3.5 - 5.1 mEq/L 4.1 3.8 4.2  Chloride 96 - 112 mEq/L 103 105 -  CO2 19  - 32 mEq/L '29 29 26  ' Calcium 8.4 - 10.5 mg/dL 9.7 10.1 10.2  Total Protein 6.0 - 8.3 g/dL 7.0 7.1 7.6  Total Bilirubin 0.2 - 1.2 mg/dL 0.4 0.4 0.51  Alkaline Phos 39 - 117 U/L 73 64 58  AST 0 - 37 U/L '23 15 18  ' ALT 0 - 35 U/L '13 10 13    ' Lab Results  Component Value Date   WBC 4.5 01/15/2021   HGB 13.3 01/15/2021   HCT 41.6 01/15/2021   MCV 89.5 01/15/2021   PLT 321 01/15/2021   NEUTROABS 1.9 01/15/2021    ASSESSMENT & PLAN:  Breast cancer of upper-inner quadrant of left female breast ([place]) Left lumpectomy: Invasive lobular carcinoma grade 1, 0.9 cm, ALH, margins negative, 0/3 lymph nodes negative, T1bN0 stage IA, ER 80%, PR 95%, HER-2 negative ratio 0.97, Ki-67 10% Oncotype DX score 21, 14% risk of recurrence intermediate risk   Adjuvant radiation therapy from 11/21/2015 to 01/10/2016   Current treatment: Antiestrogen therapy with anastrozole 1 mg by mouth daily  started October 2017 quit 2020 when she lost her insurance. Resuming it 01/15/2021 at 1 tablet every other day.   Anastrozole toxicities: 1. Intermittent hot flashes 2. muscle aches and pains     Breast cancer surveillance 1.  mammogram 10/31/20: No evidence of malignancy breast density category C   Return to clinic in 1 year for follow-up      No orders of the defined types were placed in this encounter.  The patient has a good understanding of the overall plan. she agrees with it. she will call with any problems that may develop before the next visit here.  Total time spent: 20 mins including face to face time and time spent for planning, charting and coordination of care  Rulon Eisenmenger, MD, MPH 01/15/2021  I, Thana Ates, am acting as scribe for Dr. Nicholas Lose.  I have reviewed the above documentation for accuracy and completeness, and I agree with the above.

## 2021-01-15 ENCOUNTER — Inpatient Hospital Stay (HOSPITAL_BASED_OUTPATIENT_CLINIC_OR_DEPARTMENT_OTHER): Payer: 59 | Admitting: Hematology and Oncology

## 2021-01-15 ENCOUNTER — Inpatient Hospital Stay: Payer: 59 | Attending: Hematology and Oncology

## 2021-01-15 ENCOUNTER — Other Ambulatory Visit: Payer: Self-pay

## 2021-01-15 DIAGNOSIS — N951 Menopausal and female climacteric states: Secondary | ICD-10-CM | POA: Insufficient documentation

## 2021-01-15 DIAGNOSIS — Z923 Personal history of irradiation: Secondary | ICD-10-CM | POA: Diagnosis not present

## 2021-01-15 DIAGNOSIS — C50212 Malignant neoplasm of upper-inner quadrant of left female breast: Secondary | ICD-10-CM | POA: Insufficient documentation

## 2021-01-15 DIAGNOSIS — Z79899 Other long term (current) drug therapy: Secondary | ICD-10-CM | POA: Insufficient documentation

## 2021-01-15 DIAGNOSIS — Z79811 Long term (current) use of aromatase inhibitors: Secondary | ICD-10-CM | POA: Insufficient documentation

## 2021-01-15 DIAGNOSIS — Z170 Estrogen receptor positive status [ER+]: Secondary | ICD-10-CM

## 2021-01-15 LAB — CMP (CANCER CENTER ONLY)
ALT: 10 U/L (ref 0–44)
AST: 13 U/L — ABNORMAL LOW (ref 15–41)
Albumin: 3.5 g/dL (ref 3.5–5.0)
Alkaline Phosphatase: 62 U/L (ref 38–126)
Anion gap: 7 (ref 5–15)
BUN: 12 mg/dL (ref 6–20)
CO2: 28 mmol/L (ref 22–32)
Calcium: 9.8 mg/dL (ref 8.9–10.3)
Chloride: 108 mmol/L (ref 98–111)
Creatinine: 0.93 mg/dL (ref 0.44–1.00)
GFR, Estimated: >60 mL/min (ref >60)
Glucose, Bld: 90 mg/dL (ref 70–99)
Potassium: 4.2 mmol/L (ref 3.5–5.1)
Sodium: 143 mmol/L (ref 135–145)
Total Bilirubin: 0.3 mg/dL (ref 0.3–1.2)
Total Protein: 6.8 g/dL (ref 6.5–8.1)

## 2021-01-15 LAB — CBC WITH DIFFERENTIAL (CANCER CENTER ONLY)
Abs Immature Granulocytes: 0 10*3/uL (ref 0.00–0.07)
Basophils Absolute: 0 10*3/uL (ref 0.0–0.1)
Basophils Relative: 1 %
Eosinophils Absolute: 0.3 10*3/uL (ref 0.0–0.5)
Eosinophils Relative: 6 %
HCT: 41.6 % (ref 36.0–46.0)
Hemoglobin: 13.3 g/dL (ref 12.0–15.0)
Immature Granulocytes: 0 %
Lymphocytes Relative: 41 %
Lymphs Abs: 1.9 10*3/uL (ref 0.7–4.0)
MCH: 28.6 pg (ref 26.0–34.0)
MCHC: 32 g/dL (ref 30.0–36.0)
MCV: 89.5 fL (ref 80.0–100.0)
Monocytes Absolute: 0.5 10*3/uL (ref 0.1–1.0)
Monocytes Relative: 10 %
Neutro Abs: 1.9 10*3/uL (ref 1.7–7.7)
Neutrophils Relative %: 42 %
Platelet Count: 321 10*3/uL (ref 150–400)
RBC: 4.65 MIL/uL (ref 3.87–5.11)
RDW: 13.4 % (ref 11.5–15.5)
WBC Count: 4.5 10*3/uL (ref 4.0–10.5)
nRBC: 0 % (ref 0.0–0.2)

## 2021-01-15 MED ORDER — VITAMIN B-12 1000 MCG PO TABS: 1000.0000 ug | ORAL_TABLET | Freq: Every day | ORAL | Status: AC

## 2021-01-15 MED ORDER — ADVANCED COLLAGEN PO TABS: 1.0000 | ORAL_TABLET | Freq: Every day | ORAL | Status: AC

## 2021-01-15 MED ORDER — ZINC GLUCONATE 50 MG PO TABS: 50.0000 mg | ORAL_TABLET | Freq: Every day | ORAL | Status: AC

## 2021-01-15 MED ORDER — ANASTROZOLE 1 MG PO TABS: 1.0000 mg | ORAL_TABLET | Freq: Every day | ORAL | 1 refills | Status: DC

## 2021-01-15 NOTE — Assessment & Plan Note (Addendum)
Left lumpectomy: Invasive lobular carcinoma grade 1, 0.9 cm, ALH, margins negative, 0/3 lymph nodes negative, T1bN0 stage IA, ER 80%, PR 95%, HER-2 negative ratio 0.97, Ki-67 10% Oncotype DX score 21, 14% risk of recurrence intermediate risk  Adjuvant radiation therapy from 11/21/2015 to 01/10/2016  Current treatment: Antiestrogen therapy with anastrozole 1 mg by mouth daily started October 2017 quit 2020 when she lost her insurance. Resuming it 01/15/2021 at 1 tablet every other day.  Anastrozole toxicities: 1. Intermittent hot flashes 2. muscle aches and pains    Breast cancer surveillance 1.  mammogram 10/31/20: No evidence of malignancy breast density category C  Return to clinic in1 yearfor follow-up

## 2021-02-11 ENCOUNTER — Ambulatory Visit (INDEPENDENT_AMBULATORY_CARE_PROVIDER_SITE_OTHER): Payer: 59 | Admitting: Internal Medicine

## 2021-02-11 ENCOUNTER — Other Ambulatory Visit: Payer: Self-pay

## 2021-02-11 ENCOUNTER — Encounter: Payer: Self-pay | Admitting: Internal Medicine

## 2021-02-11 DIAGNOSIS — R238 Other skin changes: Secondary | ICD-10-CM | POA: Diagnosis not present

## 2021-02-11 DIAGNOSIS — Z170 Estrogen receptor positive status [ER+]: Secondary | ICD-10-CM

## 2021-02-11 DIAGNOSIS — Z23 Encounter for immunization: Secondary | ICD-10-CM | POA: Diagnosis not present

## 2021-02-11 DIAGNOSIS — C50212 Malignant neoplasm of upper-inner quadrant of left female breast: Secondary | ICD-10-CM | POA: Diagnosis not present

## 2021-02-11 DIAGNOSIS — Z0000 Encounter for general adult medical examination without abnormal findings: Secondary | ICD-10-CM

## 2021-02-11 LAB — HEMOGLOBIN A1C: Hgb A1c MFr Bld: 5.7 % (ref 4.6–6.5)

## 2021-02-11 LAB — LIPID PANEL
Cholesterol: 211 mg/dL — ABNORMAL HIGH (ref 0–200)
HDL: 53.7 mg/dL (ref >39.00)
LDL Cholesterol: 142 mg/dL — ABNORMAL HIGH (ref 0–99)
NonHDL: 156.83
Total CHOL/HDL Ratio: 4
Triglycerides: 73 mg/dL (ref 0.0–149.0)
VLDL: 14.6 mg/dL (ref 0.0–40.0)

## 2021-02-11 MED ORDER — ALRA NON-METALLIC DEODORANT (RAD-ONC): 1.0000 "application " | Freq: Every day | TOPICAL | 11 refills | Status: DC | PRN

## 2021-02-11 NOTE — Progress Notes (Signed)
   Subjective:   Patient ID: Maria Allen, female    DOB: 29-Jun-1960, 60 y.o.   MRN: [id number]  HPI The patient is a new 61 YO female coming in to re-establish care and for ongoing care.   Also wants physical.  PMH, [place], social history reviewed and updated  Review of Systems  Constitutional: Negative.   HENT: Negative.    Eyes: Negative.   Respiratory:  Negative for cough, chest tightness and shortness of breath.   Cardiovascular:  Negative for chest pain, palpitations and leg swelling.  Gastrointestinal:  Negative for abdominal distention, abdominal pain, constipation, diarrhea, nausea and vomiting.  Musculoskeletal: Negative.   Skin: Negative.   Neurological: Negative.   Psychiatric/Behavioral: Negative.     Objective:  Physical Exam Constitutional:      Appearance: She is well-developed.  HENT:     Head: Normocephalic and atraumatic.  Cardiovascular:     Rate and Rhythm: Normal rate and regular rhythm.  Pulmonary:     Effort: Pulmonary effort is normal. No respiratory distress.     Breath sounds: Normal breath sounds. No wheezing or rales.  Abdominal:     General: Bowel sounds are normal. There is no distension.     Palpations: Abdomen is soft.     Tenderness: There is no abdominal tenderness. There is no rebound.  Musculoskeletal:     Cervical back: Normal range of motion.  Skin:    General: Skin is warm and dry.  Neurological:     Mental Status: She is alert and oriented to person, place, and time.     Coordination: Coordination normal.    Vitals:   02/11/21 0954  BP: 126/70  Pulse: 84  Resp: 18  SpO2: 98%  Weight: 252 lb 3.2 oz (114.4 kg)  Height: 5' (1.524 m)    This visit occurred during the SARS-CoV-2 public health emergency.  Safety protocols were in place, including screening questions prior to the visit, additional usage of staff PPE, and extensive cleaning of exam room while observing appropriate contact time as indicated for disinfecting  solutions.   Assessment & Plan:  Shingrix IM given at visit

## 2021-02-11 NOTE — Patient Instructions (Signed)
We will check the labs today. 

## 2021-02-14 DIAGNOSIS — R238 Other skin changes: Secondary | ICD-10-CM | POA: Insufficient documentation

## 2021-02-14 NOTE — Assessment & Plan Note (Signed)
Seeing oncology and up to date on mammogram. Taking arimidex currently every other day but was off for some time due to being lost to follow up during covid.

## 2021-02-14 NOTE — Assessment & Plan Note (Signed)
Flu shot counseled yearly. Covid-19 booster recommended. Shingrix given 1st today. Tetanus up to date. Colonoscopy up to date. Mammogram up to date, pap smear up to date. Counseled about sun safety and mole surveillance. Counseled about the dangers of distracted driving. Given 10 year screening recommendations.

## 2021-02-14 NOTE — Assessment & Plan Note (Signed)
Checking lipid panel and HgA1c for screening for complications. Counseled about diet and exercise to help with weight loss.

## 2021-02-14 NOTE — Assessment & Plan Note (Signed)
Due to prior breast cancer cannot use metallic deodorants and rx done for alra today she can use and talked to her about other natural options available in most drug stores or online which are non-metallic.

## 2021-02-26 ENCOUNTER — Other Ambulatory Visit: Payer: Self-pay

## 2021-02-26 ENCOUNTER — Ambulatory Visit (INDEPENDENT_AMBULATORY_CARE_PROVIDER_SITE_OTHER): Payer: 59 | Admitting: Internal Medicine

## 2021-02-26 ENCOUNTER — Ambulatory Visit (INDEPENDENT_AMBULATORY_CARE_PROVIDER_SITE_OTHER): Payer: 59

## 2021-02-26 ENCOUNTER — Encounter: Payer: Self-pay | Admitting: Internal Medicine

## 2021-02-26 VITALS — BP 134/82 | HR 77 | Resp 18 | Ht 60.0 in | Wt 254.0 lb

## 2021-02-26 DIAGNOSIS — M25561 Pain in right knee: Secondary | ICD-10-CM

## 2021-02-26 DIAGNOSIS — S060X0A Concussion without loss of consciousness, initial encounter: Secondary | ICD-10-CM

## 2021-02-26 DIAGNOSIS — M25521 Pain in right elbow: Secondary | ICD-10-CM

## 2021-02-26 DIAGNOSIS — M25562 Pain in left knee: Secondary | ICD-10-CM

## 2021-02-26 MED ORDER — CYCLOBENZAPRINE HCL 5 MG PO TABS: 5.0000 mg | ORAL_TABLET | Freq: Three times a day (TID) | ORAL | 1 refills | Status: DC | PRN

## 2021-02-26 MED ORDER — MELOXICAM 15 MG PO TABS: 15.0000 mg | ORAL_TABLET | Freq: Every day | ORAL | 0 refills | Status: DC

## 2021-02-26 NOTE — Patient Instructions (Addendum)
We can do x-ray on the elbow and the knees to check them out.  We have sent in meloxicam to take daily for the next 2-4 weeks. It is okay to take tylenol with this.  We have also sent in a muscle relaxer flexeril to use as needed up to twice a day.
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
## 2021-02-27 ENCOUNTER — Telehealth: Payer: Self-pay | Admitting: Internal Medicine

## 2021-02-27 NOTE — Telephone Encounter (Signed)
See below

## 2021-02-27 NOTE — Telephone Encounter (Signed)
Do we know what timeframe patient is requesting? Okay for out of work up to 2 weeks from date of visit.
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
## 2021-02-28 DIAGNOSIS — M25562 Pain in left knee: Secondary | ICD-10-CM | POA: Insufficient documentation

## 2021-02-28 DIAGNOSIS — S060XAA Concussion with loss of consciousness status unknown, initial encounter: Secondary | ICD-10-CM | POA: Insufficient documentation

## 2021-02-28 DIAGNOSIS — M25561 Pain in right knee: Secondary | ICD-10-CM | POA: Insufficient documentation

## 2021-02-28 DIAGNOSIS — M25521 Pain in right elbow: Secondary | ICD-10-CM | POA: Insufficient documentation

## 2021-02-28 NOTE — Assessment & Plan Note (Signed)
Ordered x-ray knee today to rule out traumatic injury. Rx meloxicam and flexeril for pain.
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
## 2021-02-28 NOTE — Assessment & Plan Note (Signed)
Ordered x-ray knee to rule out traumatic injury. Rx meloxicam and flexeril for pain.

## 2021-02-28 NOTE — Assessment & Plan Note (Signed)
Ordered x-ray right elbow to rule out traumatic injury. Rx meloxicam for pain.

## 2021-03-27 ENCOUNTER — Other Ambulatory Visit: Payer: Self-pay | Admitting: Internal Medicine

## 2021-04-15 ENCOUNTER — Ambulatory Visit (INDEPENDENT_AMBULATORY_CARE_PROVIDER_SITE_OTHER): Payer: 59

## 2021-04-15 ENCOUNTER — Other Ambulatory Visit: Payer: Self-pay

## 2021-04-15 DIAGNOSIS — Z23 Encounter for immunization: Secondary | ICD-10-CM | POA: Diagnosis not present

## 2021-04-15 NOTE — Progress Notes (Signed)
Pt was given 2nd shingle vacc w/o any complications.

## 2021-06-23 ENCOUNTER — Other Ambulatory Visit: Payer: Self-pay | Admitting: Internal Medicine

## 2021-08-10 ENCOUNTER — Other Ambulatory Visit: Payer: Self-pay | Admitting: Hematology and Oncology

## 2021-09-20 ENCOUNTER — Other Ambulatory Visit: Payer: Self-pay | Admitting: Hematology and Oncology

## 2021-09-20 DIAGNOSIS — Z1231 Encounter for screening mammogram for malignant neoplasm of breast: Secondary | ICD-10-CM

## 2021-09-25 ENCOUNTER — Other Ambulatory Visit: Payer: Self-pay | Admitting: Internal Medicine

## 2021-10-02 ENCOUNTER — Other Ambulatory Visit: Payer: Self-pay | Admitting: *Deleted

## 2021-10-02 MED ORDER — ANASTROZOLE 1 MG PO TABS: 1.0000 mg | ORAL_TABLET | Freq: Every day | ORAL | 1 refills | Status: DC

## 2021-11-11 ENCOUNTER — Ambulatory Visit
Admission: RE | Admit: 2021-11-11 | Discharge: 2021-11-11 | Disposition: A | Discharge status: Home / Self Care | Payer: Commercial Managed Care - HMO | Source: Ambulatory Visit | Attending: Hematology and Oncology | Admitting: Hematology and Oncology

## 2021-11-11 DIAGNOSIS — Z1231 Encounter for screening mammogram for malignant neoplasm of breast: Secondary | ICD-10-CM

## 2022-01-20 ENCOUNTER — Ambulatory Visit: Payer: 59 | Admitting: Hematology and Oncology

## 2022-01-25 NOTE — Progress Notes (Signed)
-  Patient Care Team: Hoyt Koch, MD as PCP - General (Internal Medicine)  DIAGNOSIS: No diagnosis found.  SUMMARY OF ONCOLOGIC HISTORY: Oncology History  Breast cancer of upper-inner quadrant of left female breast ([place])  08/27/2015 Initial Diagnosis   Left breast biopsy 11:00: Invasive lobular cancer with LCIS, grade 1,ER 80%, PR 95%, Ki-67 10%, HER-2 Negativeratio 0.97, T1 BN 0 stage I a clinical stage   08/27/2015 Mammogram   Screening detected left breast asymmetry posteriorly at 11:00 position 9 x 8 x 5 mm by ultrasound, axilla negative   10/09/2015 Surgery   Left lumpectomy Barry Dienes): Invasive lobular carcinoma grade 1, 0.9 cm, ALH, margins negative, 0/3 lymph nodes negative, T1bN0 stage IA, ER 80%, PR 95%, HER-2 negative ratio 0.97, Ki-67 10%, Oncotype DX 21, 14% ROR   10/09/2015 Oncotype testing   Recurrence score: 21; 14% ROR (intermediate-risk)    11/21/2015 - 01/10/2016 Radiation Therapy   Adjuvant radiation therapy Lisbeth Renshaw). Left breast: 50.4 Gy in 28 fractions. Left breast boost: 10 Gy in 5 fractions    02/2016 -  Anti-estrogen oral therapy   Anastrozole 1 mg daily.      CHIEF COMPLIANT: Follow-up breast cancer on anastrozole   INTERVAL HISTORY: Maria Allen is a 60 y.o. with above-mentioned history of left breast cancer currently on antiestrogen therapy with anastrozole. She presents to the clinic for a follow-up.    ALLERGIES:  is allergic to gadolinium derivatives.  MEDICATIONS:  Current Outpatient Medications  Medication Sig Dispense Refill   anastrozole (ARIMIDEX) 1 MG tablet Take 1 tablet (1 mg total) by mouth daily. Please give 90 tabs 90 tablet 1   aspirin 325 MG tablet Take 650 mg by mouth daily as needed for moderate pain or headache. Reported on 09/05/2015     cholecalciferol (VITAMIN D) 1000 units tablet Take 2,000 Units by mouth daily.     cyclobenzaprine (FLEXERIL) 5 MG tablet Take 1 tablet (5 mg total) by mouth 3 (three) times daily as  needed for muscle spasms. 30 tablet 1   docusate sodium (COLACE) 100 MG capsule Take 100 mg by mouth every other day.     Garlic 0350 MG CAPS Take 1,000 mg by mouth daily.     Ginkgo Biloba 60 MG CAPS Take 60 mg by mouth daily.     glucosamine-chondroitin 500-400 MG tablet Take 1 tablet by mouth 3 (three) times daily. (Patient taking differently: Take 2 tablets by mouth daily.) 270 tablet 3   meloxicam (MOBIC) 15 MG tablet TAKE 1 TABLET (15 MG TOTAL) BY MOUTH DAILY. 30 tablet 5   non-metallic deodorant (ALRA) MISC Apply 1 application topically daily as needed. 1 each 11   Specialty Vitamins Products (ADVANCED COLLAGEN) TABS Take 1 tablet by mouth daily.     TURMERIC PO Take 1 tablet by mouth every other day.     vitamin B-12 (CYANOCOBALAMIN) 1000 MCG tablet Take 1 tablet (1,000 mcg total) by mouth daily.     vitamin C (ASCORBIC ACID) 250 MG tablet Take 1,000 mg by mouth daily.     vitamin E 180 MG (400 UNITS) capsule Take 400 Units by mouth daily.     zinc gluconate 50 MG tablet Take 1 tablet (50 mg total) by mouth daily.     No current facility-administered medications for this visit.    PHYSICAL EXAMINATION: ECOG PERFORMANCE STATUS: {CHL ONC ECOG PS:[id number]}  There were no vitals filed for this visit. There were no vitals filed for this visit.  BREAST:*** No palpable masses or nodules in either right or left breasts. No palpable axillary supraclavicular or infraclavicular adenopathy no breast tenderness or nipple discharge. (exam performed in the presence of a chaperone)  LABORATORY DATA:  I have reviewed the data as listed    Latest Ref Rng & Units 01/15/2021    2:42 PM 08/24/2017    3:39 PM 08/19/2016   10:22 AM  CMP  Glucose 70 - 99 mg/dL 90  82  81   BUN 6 - 20 mg/dL _0 Creatinine 0.44 - 1.00 mg/dL 0.93  0.69  0.81   Sodium 135 - 145 mmol/L 143  139  139   Potassium 3.5 - 5.1 mmol/L 4.2  4.1  3.8   Chloride 98 - 111 mmol/L 108  103  105   CO2 22 - 32 mmol/L _1 Calcium 8.9 - 10.3 mg/dL 9.8  9.7  10.1   Total Protein 6.5 - 8.1 g/dL 6.8  7.0  7.1   Total Bilirubin 0.3 - 1.2 mg/dL 0.3  0.4  0.4   Alkaline Phos 38 - 126 U/L 62  73  64   AST 15 - 41 U/L _2 ALT 0 - 44 U/L _3 Lab Results  Component Value Date   WBC 4.5 01/15/2021   HGB 13.3 01/15/2021   HCT 41.6 01/15/2021   MCV 89.5 01/15/2021   PLT 321 01/15/2021   NEUTROABS 1.9 01/15/2021    ASSESSMENT & PLAN:  No problem-specific Assessment & Plan notes found for this encounter.    No orders of the defined types were placed in this encounter.  The patient has a good understanding of the overall plan. she agrees with it. she will call with any problems that may develop before the next visit here. Total time spent: 30 mins including face to face time and time spent for planning, charting and co-ordination of care   Suzzette Righter, [place] 01/25/22    I Gardiner Coins am scribing for Dr. Lindi Adie  ***

## 2022-01-28 ENCOUNTER — Other Ambulatory Visit: Payer: Self-pay

## 2022-01-28 ENCOUNTER — Inpatient Hospital Stay: Payer: Commercial Managed Care - HMO | Attending: Hematology and Oncology | Admitting: Hematology and Oncology

## 2022-01-28 DIAGNOSIS — Z79811 Long term (current) use of aromatase inhibitors: Secondary | ICD-10-CM | POA: Insufficient documentation

## 2022-01-28 DIAGNOSIS — Z923 Personal history of irradiation: Secondary | ICD-10-CM | POA: Diagnosis not present

## 2022-01-28 DIAGNOSIS — Z170 Estrogen receptor positive status [ER+]: Secondary | ICD-10-CM | POA: Insufficient documentation

## 2022-01-28 DIAGNOSIS — C50212 Malignant neoplasm of upper-inner quadrant of left female breast: Secondary | ICD-10-CM | POA: Insufficient documentation

## 2022-01-28 NOTE — Assessment & Plan Note (Addendum)
Left lumpectomy: Invasive lobular carcinoma grade 1, 0.9 cm, ALH, margins negative, 0/3 lymph nodes negative, T1bN0 stage IA, ER 80%, PR 95%, HER-2 negative ratio 0.97, Ki-67 10% Oncotype DX score 21, 14% risk of recurrence intermediate risk  Adjuvant radiation therapy from 11/21/2015 to 01/10/2016  Current treatment: Antiestrogen therapy with anastrozole 1 mg by mouth daily started October 2017 quit 2020 when she lost her insurance. Resumed it 01/15/2021 at 1 tablet every other day. Plan to completed in 1 year  Anastrozole toxicities: 1. Intermittent hot flashes 2. muscle aches and pains   Breast cancer surveillance 1.mammogram  11/11/2021: No evidence of malignancy breast density category C 2. breast exam 01/28/2022: Benign  Return to clinic in1 yearfor follow-up

## 2022-02-17 ENCOUNTER — Encounter: Payer: 59 | Admitting: Internal Medicine

## 2022-02-24 ENCOUNTER — Encounter: Payer: 59 | Admitting: Internal Medicine

## 2022-02-26 ENCOUNTER — Encounter: Payer: 59 | Admitting: Internal Medicine

## 2022-03-03 ENCOUNTER — Encounter: Payer: Self-pay | Admitting: Internal Medicine

## 2022-03-03 ENCOUNTER — Ambulatory Visit (INDEPENDENT_AMBULATORY_CARE_PROVIDER_SITE_OTHER): Payer: Commercial Managed Care - HMO | Admitting: Internal Medicine

## 2022-03-03 VITALS — BP 160/100 | HR 85 | Temp 98.1°F | Ht 65.0 in | Wt 252.0 lb

## 2022-03-03 DIAGNOSIS — Z0000 Encounter for general adult medical examination without abnormal findings: Secondary | ICD-10-CM | POA: Diagnosis not present

## 2022-03-03 DIAGNOSIS — R030 Elevated blood-pressure reading, without diagnosis of hypertension: Secondary | ICD-10-CM | POA: Diagnosis not present

## 2022-03-03 DIAGNOSIS — I10 Essential (primary) hypertension: Secondary | ICD-10-CM | POA: Insufficient documentation

## 2022-03-03 LAB — COMPREHENSIVE METABOLIC PANEL
ALT: 9 U/L (ref 0–35)
AST: 12 U/L (ref 0–37)
Albumin: 3.7 g/dL (ref 3.5–5.2)
Alkaline Phosphatase: 65 U/L (ref 39–117)
BUN: 13 mg/dL (ref 6–23)
CO2: 29 mEq/L (ref 19–32)
Calcium: 9.4 mg/dL (ref 8.4–10.5)
Chloride: 106 mEq/L (ref 96–112)
Creatinine, Ser: 0.78 mg/dL (ref 0.40–1.20)
GFR: 81.75 mL/min (ref >60.00)
Glucose, Bld: 83 mg/dL (ref 70–99)
Potassium: 3.8 mEq/L (ref 3.5–5.1)
Sodium: 140 mEq/L (ref 135–145)
Total Bilirubin: 0.4 mg/dL (ref 0.2–1.2)
Total Protein: 6.6 g/dL (ref 6.0–8.3)

## 2022-03-03 LAB — CBC
HCT: 40.5 % (ref 36.0–46.0)
Hemoglobin: 13.2 g/dL (ref 12.0–15.0)
MCHC: 32.7 g/dL (ref 30.0–36.0)
MCV: 87.9 fl (ref 78.0–100.0)
Platelets: 322 10*3/uL (ref 150.0–400.0)
RBC: 4.61 Mil/uL (ref 3.87–5.11)
RDW: 14.4 % (ref 11.5–15.5)
WBC: 4.5 10*3/uL (ref 4.0–10.5)

## 2022-03-03 LAB — LIPID PANEL
Cholesterol: 224 mg/dL — ABNORMAL HIGH (ref 0–200)
HDL: 55.5 mg/dL (ref >39.00)
LDL Cholesterol: 148 mg/dL — ABNORMAL HIGH (ref 0–99)
NonHDL: 168.01
Total CHOL/HDL Ratio: 4
Triglycerides: 102 mg/dL (ref 0.0–149.0)
VLDL: 20.4 mg/dL (ref 0.0–40.0)

## 2022-03-03 LAB — HEMOGLOBIN A1C: Hgb A1c MFr Bld: 5.8 % (ref 4.6–6.5)

## 2022-03-03 MED ORDER — CONTRAVE 8-90 MG PO TB12: ORAL_TABLET | ORAL | 0 refills | Status: DC

## 2022-03-03 NOTE — Patient Instructions (Addendum)
We have sent in contrave to take for the weight.  We will have you check the blood pressure about 1-2 times a week for 1 month and call us with the results.

## 2022-03-03 NOTE — Assessment & Plan Note (Signed)
BP elevated today. EKG done without changes. She will monitor at home and let us know readings. Adjust as needed. She is working on weight loss and counseled about DASH diet.

## 2022-03-03 NOTE — Assessment & Plan Note (Signed)
Flu shot up to date. Covid-19 up to date. Shingrix complete. Tetanus up to date. Colonoscopy up to date. Mammogram up to date, pap smear up to date. Counseled about sun safety and mole surveillance. Counseled about the dangers of distracted driving. Given 10 year screening recommendations.   

## 2022-03-03 NOTE — Progress Notes (Signed)
   Subjective:   Patient ID: Maria Allen, female    DOB: Apr 10, 1961, 61 y.o.   MRN: [id number]  HPI The patient is here for physical.  PMH, [facility], social history reviewed and updated  Review of Systems  Constitutional: Negative.   HENT: Negative.    Eyes: Negative.   Respiratory:  Negative for cough, chest tightness and shortness of breath.   Cardiovascular:  Negative for chest pain, palpitations and leg swelling.  Gastrointestinal:  Negative for abdominal distention, abdominal pain, constipation, diarrhea, nausea and vomiting.  Musculoskeletal: Negative.   Skin: Negative.   Neurological: Negative.   Psychiatric/Behavioral: Negative.      Objective:  Physical Exam Constitutional:      Appearance: She is well-developed.  HENT:     Head: Normocephalic and atraumatic.  Cardiovascular:     Rate and Rhythm: Normal rate and regular rhythm.  Pulmonary:     Effort: Pulmonary effort is normal. No respiratory distress.     Breath sounds: Normal breath sounds. No wheezing or rales.  Abdominal:     General: Bowel sounds are normal. There is no distension.     Palpations: Abdomen is soft.     Tenderness: There is no abdominal tenderness. There is no rebound.  Musculoskeletal:     Cervical back: Normal range of motion.  Skin:    General: Skin is warm and dry.  Neurological:     Mental Status: She is alert and oriented to person, place, and time.     Coordination: Coordination normal.     Vitals:   03/03/22 0844 03/03/22 0848  BP: (!) 160/100 (!) 160/100  Pulse: 85   Temp: 98.1 F (36.7 C)   TempSrc: Oral   SpO2: 97%   Weight: 252 lb (114.3 kg)   Height: '5\' 5"'$  (1.651 m)    EKG: Rate 75, axis normal, interval normal, sinus, no st or t wave changes, no prior to compare   Assessment & Plan:

## 2022-03-03 NOTE — Assessment & Plan Note (Signed)
Rx contrave to help with weight loss. Counseled about diet and exercise.

## 2022-03-29 ENCOUNTER — Other Ambulatory Visit: Payer: Self-pay | Admitting: Internal Medicine

## 2022-03-31 ENCOUNTER — Other Ambulatory Visit: Payer: Self-pay | Admitting: *Deleted

## 2022-03-31 MED ORDER — ANASTROZOLE 1 MG PO TABS: 1.0000 mg | ORAL_TABLET | Freq: Every day | ORAL | 3 refills | Status: DC

## 2022-09-24 ENCOUNTER — Other Ambulatory Visit: Payer: Self-pay | Admitting: Hematology and Oncology

## 2022-09-24 DIAGNOSIS — Z0000 Encounter for general adult medical examination without abnormal findings: Secondary | ICD-10-CM

## 2022-11-17 ENCOUNTER — Ambulatory Visit: Payer: Commercial Managed Care - HMO

## 2022-12-02 ENCOUNTER — Ambulatory Visit
Admission: RE | Admit: 2022-12-02 | Discharge: 2022-12-02 | Disposition: A | Discharge status: Home / Self Care | Payer: Commercial Managed Care - HMO | Source: Ambulatory Visit | Attending: Hematology and Oncology | Admitting: Hematology and Oncology

## 2022-12-02 DIAGNOSIS — Z0000 Encounter for general adult medical examination without abnormal findings: Secondary | ICD-10-CM

## 2023-01-29 NOTE — Assessment & Plan Note (Addendum)
Left lumpectomy: Invasive lobular carcinoma grade 1, 0.9 cm, ALH, margins negative, 0/3 lymph nodes negative, T1bN0 stage IA, ER 80%, PR 95%, HER-2 negative ratio 0.97, Ki-67 10% Oncotype DX score 21, 14% risk of recurrence intermediate risk   Adjuvant radiation therapy from 11/21/2015 to 01/10/2016   Current treatment: Antiestrogen therapy with anastrozole 1 mg by mouth daily  started October 2017 quit 2020 when she lost her insurance. Resumed it 01/15/2021 at 1 tablet every other day. Plan to completed in 1 year   Anastrozole toxicities: 1. Intermittent hot flashes 2. muscle aches and pains     Breast cancer surveillance 1.  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  She is very excited about that. Return to clinic in 1 year for follow-up

## 2023-02-02 ENCOUNTER — Inpatient Hospital Stay (HOSPITAL_BASED_OUTPATIENT_CLINIC_OR_DEPARTMENT_OTHER): Payer: Commercial Managed Care - HMO | Admitting: Hematology and Oncology

## 2023-02-02 ENCOUNTER — Other Ambulatory Visit: Payer: Self-pay

## 2023-02-02 VITALS — BP 152/72 | HR 86 | Temp 97.7°F | Resp 18 | Ht 65.0 in | Wt 240.2 lb

## 2023-02-02 DIAGNOSIS — Z170 Estrogen receptor positive status [ER+]: Secondary | ICD-10-CM

## 2023-02-02 DIAGNOSIS — C50212 Malignant neoplasm of upper-inner quadrant of left female breast: Secondary | ICD-10-CM

## 2023-02-02 MED ORDER — ANASTROZOLE 1 MG PO TABS: 1.0000 mg | ORAL_TABLET | Freq: Every day | ORAL | 3 refills | Status: AC

## 2023-02-02 NOTE — Progress Notes (Signed)
HEMATOLOGY-ONCOLOGY TELEPHONE VISIT PROGRESS NOTE  I connected with our patient on 02/02/23 at  1:45 PM EDT by telephone and verified that I am speaking with the correct person using two identifiers.  I discussed the limitations, risks, security and privacy concerns of performing an evaluation and management service by telephone and the availability of in person appointments.  I also discussed with the patient that there may be a patient responsible charge related to this service. The patient expressed understanding and agreed to proceed.   History of Present Illness:  Discussed the use of AI scribe software for clinical note transcription with the patient, who gave verbal consent to proceed.  History of Present Illness   The patient, a breast cancer survivor, is in her eighth year post-diagnosis. She has been on anastrozole for a total of five years, with a break in between. Currently, she is taking the medication every other night. She reports no major side effects such as joint stiffness or hot flashes. Her most recent mammograms, conducted in July, showed no abnormalities.  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 This project is keeping her busy and happy. She plans to continue working for another 20 years.        Oncology History  Breast cancer of upper-inner quadrant of left female breast (HCC)  08/27/2015 Initial Diagnosis   Left breast biopsy 11:00: Invasive lobular cancer with LCIS, grade 1,ER 80%, PR 95%, Ki-67 10%, HER-2 Negativeratio 0.97, T1 BN 0 stage I a clinical stage   08/27/2015 Mammogram   Screening detected left breast asymmetry posteriorly at 11:00 position 9 x 8 x 5 mm by ultrasound, axilla negative   10/09/2015 Surgery   Left lumpectomy Donell Beers): Invasive lobular carcinoma grade 1, 0.9 cm, ALH, margins negative, 0/3 lymph nodes negative, T1bN0 stage IA, ER 80%, PR 95%, HER-2 negative ratio 0.97,  Ki-67 10%, Oncotype DX 21, 14% ROR   10/09/2015 Oncotype testing   Recurrence score: 21; 14% ROR (intermediate-risk)    11/21/2015 - 01/10/2016 Radiation Therapy   Adjuvant radiation therapy Mitzi Hansen). Left breast: 50.4 Gy in 28 fractions. Left breast boost: 10 Gy in 5 fractions    02/2016 -  Anti-estrogen oral therapy   Anastrozole 1 mg daily.      REVIEW OF SYSTEMS:   Constitutional: Denies fevers, chills or abnormal weight loss All other systems were reviewed with the patient and are negative. Observations/Objective:     Assessment Plan:  Breast cancer of upper-inner quadrant of left female breast (HCC) Left lumpectomy: Invasive lobular carcinoma grade 1, 0.9 cm, ALH, margins negative, 0/3 lymph nodes negative, T1bN0 stage IA, ER 80%, PR 95%, HER-2 negative ratio 0.97, Ki-67 10% Oncotype DX score 21, 14% risk of recurrence intermediate risk   Adjuvant radiation therapy from 11/21/2015 to 01/10/2016   Current treatment: Antiestrogen therapy with anastrozole 1 mg by mouth daily  started October 2017 quit 2020 when she lost her insurance. Resumed it 01/15/2021 at 1 tablet every other day.    Anastrozole toxicities: 1. Intermittent hot flashes 2. muscle aches and pains     Breast cancer surveillance 1.  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  She is very excited about that. Return to clinic in 1 year for follow-up ---------------------------------     Breast Cancer In remission for 7+ years. Tolerating Anastrozole well with no  reported side effects. Mammograms in July were normal. -Continue Anastrozole every other night. -Sent a year's worth of refills to the pharmacy. -Schedule follow-up appointment in 1 year.          I discussed the assessment and treatment plan with the patient. The patient was provided an opportunity to ask questions and all were answered. The patient agreed with  the plan and demonstrated an understanding of the instructions. The patient was advised to call back or seek an in-person evaluation if the symptoms worsen or if the condition fails to improve as anticipated.   I provided 12 minutes of non-face-to-face time during this encounter.  This includes time for charting and coordination of care   Tamsen Meek, MD

## 2023-02-05 ENCOUNTER — Telehealth: Payer: Commercial Managed Care - HMO | Admitting: Hematology and Oncology

## 2023-03-05 ENCOUNTER — Ambulatory Visit (INDEPENDENT_AMBULATORY_CARE_PROVIDER_SITE_OTHER): Payer: Managed Care, Other (non HMO) | Admitting: Internal Medicine

## 2023-03-05 ENCOUNTER — Encounter: Payer: Self-pay | Admitting: Internal Medicine

## 2023-03-05 VITALS — BP 140/100 | HR 94 | Temp 98.1°F | Ht 65.0 in | Wt 242.0 lb

## 2023-03-05 DIAGNOSIS — Z0000 Encounter for general adult medical examination without abnormal findings: Secondary | ICD-10-CM | POA: Diagnosis not present

## 2023-03-05 DIAGNOSIS — Z170 Estrogen receptor positive status [ER+]: Secondary | ICD-10-CM | POA: Diagnosis not present

## 2023-03-05 DIAGNOSIS — Z23 Encounter for immunization: Secondary | ICD-10-CM

## 2023-03-05 DIAGNOSIS — C50212 Malignant neoplasm of upper-inner quadrant of left female breast: Secondary | ICD-10-CM

## 2023-03-05 DIAGNOSIS — I10 Essential (primary) hypertension: Secondary | ICD-10-CM | POA: Diagnosis not present

## 2023-03-05 MED ORDER — ALRA NON-METALLIC DEODORANT (RAD-ONC): 1.0000 | Freq: Every day | TOPICAL | 11 refills | Status: AC | PRN

## 2023-03-05 MED ORDER — CONTRAVE 8-90 MG PO TB12: ORAL_TABLET | ORAL | 0 refills | Status: AC

## 2023-03-05 MED ORDER — GLUCOSAMINE-CHONDROITIN 500-400 MG PO TABS: 1.0000 | ORAL_TABLET | Freq: Three times a day (TID) | ORAL | 3 refills | Status: AC

## 2023-03-05 MED ORDER — VALSARTAN-HYDROCHLOROTHIAZIDE 80-12.5 MG PO TABS: 1.0000 | ORAL_TABLET | Freq: Every day | ORAL | 3 refills | Status: DC

## 2023-03-05 NOTE — Patient Instructions (Addendum)
We will have you stop losartan and start taking valsartan/hydrochlorothiazide. Come back in about 1 month to check the blood pressure.

## 2023-03-06 ENCOUNTER — Encounter: Payer: Self-pay | Admitting: Internal Medicine

## 2023-03-06 NOTE — Assessment & Plan Note (Signed)
Refill contrave as she tried this before and did okay with this. She is motivated to make changes.

## 2023-03-06 NOTE — Assessment & Plan Note (Signed)
Will stop losartan 100 mg daily and start valsartan/hydrochlorothiazide 80/12.5 mg daily and return in 2-3 weeks to assess BP and labs. Checking CMP today as none in last year unclear if any since start of losartan.

## 2023-03-06 NOTE — Assessment & Plan Note (Signed)
Flu shot given. Shingrix complete. Tetanus up to date. Colonoscopy up to date. Mammogram up to date, pap smear up to date. Counseled about sun safety and mole surveillance. Counseled about the dangers of distracted driving. Given 10 year screening recommendations.

## 2023-03-06 NOTE — Assessment & Plan Note (Signed)
Taking arimidex and getting mammogram yearly.

## 2023-09-07 DIAGNOSIS — L309 Dermatitis, unspecified: Secondary | ICD-10-CM | POA: Diagnosis not present

## 2023-09-07 DIAGNOSIS — Z809 Family history of malignant neoplasm, unspecified: Secondary | ICD-10-CM | POA: Diagnosis not present

## 2023-09-07 DIAGNOSIS — I10 Essential (primary) hypertension: Secondary | ICD-10-CM | POA: Diagnosis not present

## 2023-09-07 DIAGNOSIS — Z833 Family history of diabetes mellitus: Secondary | ICD-10-CM | POA: Diagnosis not present

## 2023-09-07 DIAGNOSIS — Z5986 Financial insecurity: Secondary | ICD-10-CM | POA: Diagnosis not present

## 2023-09-07 DIAGNOSIS — Z5948 Other specified lack of adequate food: Secondary | ICD-10-CM | POA: Diagnosis not present

## 2023-09-07 DIAGNOSIS — Z823 Family history of stroke: Secondary | ICD-10-CM | POA: Diagnosis not present

## 2023-09-07 DIAGNOSIS — R32 Unspecified urinary incontinence: Secondary | ICD-10-CM | POA: Diagnosis not present

## 2023-09-07 DIAGNOSIS — Z6841 Body Mass Index (BMI) 40.0 and over, adult: Secondary | ICD-10-CM | POA: Diagnosis not present

## 2023-10-27 ENCOUNTER — Other Ambulatory Visit: Payer: Self-pay | Admitting: Hematology and Oncology

## 2023-10-27 DIAGNOSIS — Z1231 Encounter for screening mammogram for malignant neoplasm of breast: Secondary | ICD-10-CM

## 2023-12-07 ENCOUNTER — Ambulatory Visit
Admission: RE | Admit: 2023-12-07 | Discharge: 2023-12-07 | Disposition: A | Discharge status: Home / Self Care | Payer: Self-pay | Source: Ambulatory Visit | Attending: Hematology and Oncology | Admitting: Hematology and Oncology

## 2023-12-07 DIAGNOSIS — Z1231 Encounter for screening mammogram for malignant neoplasm of breast: Secondary | ICD-10-CM

## 2023-12-08 DIAGNOSIS — Z0000 Encounter for general adult medical examination without abnormal findings: Secondary | ICD-10-CM | POA: Diagnosis not present

## 2023-12-08 DIAGNOSIS — Z01411 Encounter for gynecological examination (general) (routine) with abnormal findings: Secondary | ICD-10-CM | POA: Diagnosis not present

## 2023-12-08 DIAGNOSIS — D219 Benign neoplasm of connective and other soft tissue, unspecified: Secondary | ICD-10-CM | POA: Diagnosis not present

## 2023-12-08 DIAGNOSIS — Z124 Encounter for screening for malignant neoplasm of cervix: Secondary | ICD-10-CM | POA: Diagnosis not present

## 2023-12-08 DIAGNOSIS — D216 Benign neoplasm of connective and other soft tissue of trunk, unspecified: Secondary | ICD-10-CM | POA: Diagnosis not present

## 2023-12-08 DIAGNOSIS — Z01419 Encounter for gynecological examination (general) (routine) without abnormal findings: Secondary | ICD-10-CM | POA: Diagnosis not present

## 2023-12-08 DIAGNOSIS — Z780 Asymptomatic menopausal state: Secondary | ICD-10-CM | POA: Diagnosis not present

## 2023-12-08 DIAGNOSIS — I10 Essential (primary) hypertension: Secondary | ICD-10-CM | POA: Diagnosis not present

## 2023-12-14 DIAGNOSIS — L720 Epidermal cyst: Secondary | ICD-10-CM | POA: Diagnosis not present

## 2023-12-22 ENCOUNTER — Ambulatory Visit (INDEPENDENT_AMBULATORY_CARE_PROVIDER_SITE_OTHER): Admitting: Internal Medicine

## 2023-12-22 ENCOUNTER — Encounter: Payer: Self-pay | Admitting: Internal Medicine

## 2023-12-22 VITALS — BP 132/82 | HR 89 | Temp 98.8°F | Ht 65.0 in | Wt 236.0 lb

## 2023-12-22 DIAGNOSIS — I10 Essential (primary) hypertension: Secondary | ICD-10-CM

## 2023-12-22 NOTE — Patient Instructions (Addendum)
 We will leave the medicine the same.

## 2023-12-22 NOTE — Progress Notes (Unsigned)
   Subjective:   Patient ID: Maria Allen, female    DOB: 12-20-1960, 62 y.o.   MRN: [id number]  Discussed the use of AI scribe software for clinical note transcription with the patient, who gave verbal consent to proceed.  History of Present Illness Maria Allen is a 63 year old female who presents with elevated blood pressure readings noted at a gynecologist's visit.  Her blood pressure was noted to be high during a recent visit to her gynecologist. At home, her blood pressure readings are generally within normal range, typically in the 120s/70s or 80s. She has been using a home blood pressure machine that is approximately 64 years old.  She started taking magnesium glycinate on August 7th, after researching its potential benefits for blood pressure management. She takes this supplement at night. Additionally, she is on valsartan  80 mg, which she takes in the morning, noting that this is a low dose of the medication.  No new headaches, migraines, chest pain, pressure, tightness, heart racing, or breathing difficulties. She describes feeling 'a little tense' during medical appointments, which she likens to a 'test taking anxiety' that might affect her blood pressure readings.  Her blood pressure readings have been slightly elevated during medical visits over the past couple of years, both at her current provider's office and at the gynecologist's office. She speculates that this might be due to feeling 'antsy' during appointments, although she does not feel overtly anxious.  Review of Systems  Objective:  Physical Exam  Vitals:   12/22/23 1557  BP: (!) 138/100  Pulse: 89  Temp: 98.8 F (37.1 C)  TempSrc: Oral  SpO2: 98%  Weight: 236 lb (107 kg)  Height: 5' 5 (1.651 m)    Assessment and Plan Assessment & Plan Essential hypertension   Home blood pressure readings are within target range, with a recent elevated reading likely due to situational factors. Valsartan  80 mg remains  effective, and no new symptoms are reported. Magnesium glycinate has been started as a supplement. Continue Valsartan  80 mg daily. Bring the home blood pressure machine to the next appointment for calibration. Obtain recent blood work records from the gynecologist.

## 2023-12-25 ENCOUNTER — Encounter: Payer: Self-pay | Admitting: Internal Medicine

## 2023-12-25 NOTE — Assessment & Plan Note (Signed)
 BP at goal today on current regimen continue valsartan /hydrochlorothiazide  and monitor closely.

## 2024-02-01 ENCOUNTER — Inpatient Hospital Stay: Payer: Commercial Managed Care - HMO | Attending: Hematology and Oncology | Admitting: Hematology and Oncology

## 2024-02-01 VITALS — BP 113/79 | HR 92 | Temp 97.6°F | Resp 16 | Ht 65.0 in | Wt 233.9 lb

## 2024-02-01 DIAGNOSIS — Z780 Asymptomatic menopausal state: Secondary | ICD-10-CM | POA: Diagnosis not present

## 2024-02-01 DIAGNOSIS — C50212 Malignant neoplasm of upper-inner quadrant of left female breast: Secondary | ICD-10-CM | POA: Diagnosis not present

## 2024-02-01 DIAGNOSIS — Z170 Estrogen receptor positive status [ER+]: Secondary | ICD-10-CM | POA: Diagnosis not present

## 2024-02-01 DIAGNOSIS — Z79899 Other long term (current) drug therapy: Secondary | ICD-10-CM | POA: Diagnosis not present

## 2024-02-01 DIAGNOSIS — Z923 Personal history of irradiation: Secondary | ICD-10-CM | POA: Insufficient documentation

## 2024-02-01 DIAGNOSIS — Z1732 Human epidermal growth factor receptor 2 negative status: Secondary | ICD-10-CM | POA: Diagnosis not present

## 2024-02-01 DIAGNOSIS — Z79811 Long term (current) use of aromatase inhibitors: Secondary | ICD-10-CM | POA: Diagnosis not present

## 2024-02-01 DIAGNOSIS — Z1721 Progesterone receptor positive status: Secondary | ICD-10-CM | POA: Diagnosis not present

## 2024-02-01 NOTE — Progress Notes (Signed)
 Patient Care Team: Rollene Almarie LABOR, MD as PCP - General (Internal Medicine)  DIAGNOSIS:  Encounter Diagnosis  Name Primary?   Malignant neoplasm of upper-inner quadrant of left breast in female, estrogen receptor positive (HCC) Yes    SUMMARY OF ONCOLOGIC HISTORY: Oncology History  Breast cancer of upper-inner quadrant of left female breast (HCC)  08/27/2015 Initial Diagnosis   Left breast biopsy 11:00: Invasive lobular cancer with LCIS, grade 1,ER 80%, PR 95%, Ki-67 10%, HER-2 Negativeratio 0.97, T1 BN 0 stage I a clinical stage   08/27/2015 Mammogram   Screening detected left breast asymmetry posteriorly at 11:00 position 9 x 8 x 5 mm by ultrasound, axilla negative   10/09/2015 Surgery   Left lumpectomy Azucena): Invasive lobular carcinoma grade 1, 0.9 cm, ALH, margins negative, 0/3 lymph nodes negative, T1bN0 stage IA, ER 80%, PR 95%, HER-2 negative ratio 0.97, Ki-67 10%, Oncotype DX 21, 14% ROR   10/09/2015 Oncotype testing   Recurrence score: 21; 14% ROR (intermediate-risk)    11/21/2015 - 01/10/2016 Radiation Therapy   Adjuvant radiation therapy Valene). Left breast: 50.4 Gy in 28 fractions. Left breast boost: 10 Gy in 5 fractions    02/2016 -  Anti-estrogen oral therapy   Anastrozole  1 mg daily.      CHIEF COMPLIANT:   HISTORY OF PRESENT ILLNESS:   History of Present Illness Maria Allen is a 63 year old female with hypertension who presents for a follow-up visit.  She actively monitors her blood pressure three to four times daily, with recent readings of 117/70 mmHg. She takes magnesium glycinate as needed for elevated blood pressure.  She continues letrozole, one pill every other night, and organizes her medication in a pill box for consistency.  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     ALLERGIES:  is allergic to gadolinium  derivatives.  MEDICATIONS:  Current Outpatient Medications  Medication Sig Dispense Refill   anastrozole  (ARIMIDEX ) 1 MG tablet Take 1 tablet (1 mg total) by mouth daily. Please give 90 tabs 90 tablet 3   aspirin 325 MG tablet Take 650 mg by mouth daily as needed for moderate pain or headache. Reported on 09/05/2015     cholecalciferol (VITAMIN D) 1000 units tablet Take 2,000 Units by mouth daily.     docusate sodium (COLACE) 100 MG capsule Take 100 mg by mouth every other day.     Garlic 1000 MG CAPS Take 1,000 mg by mouth daily.     Ginkgo Biloba 60 MG CAPS Take 60 mg by mouth daily.     glucosamine-chondroitin 500-400 MG tablet Take 1 tablet by mouth 3 (three) times daily. 270 tablet 3   Naltrexone-buPROPion HCl ER (CONTRAVE ) 8-90 MG TB12 Start 1 tablet every morning for 7 days, then 1 tablet twice daily for 7 days, then 2 tablets every morning and one every evening 120 tablet 0   non-metallic deodorant (ALRA) MISC Apply 1 Application topically daily as needed. 1 each 11   Specialty Vitamins Products (ADVANCED COLLAGEN) TABS Take 1 tablet by mouth daily.     TURMERIC PO Take 1 tablet by mouth every other day.     valsartan -hydrochlorothiazide  (DIOVAN -HCT) 80-12.5 MG tablet Take 1 tablet by mouth daily. 90 tablet 3   vitamin B-12 (CYANOCOBALAMIN) 1000 MCG tablet Take 1 tablet (1,000 mcg total) by mouth daily.  vitamin C (ASCORBIC ACID) 250 MG tablet Take 1,000 mg by mouth daily.     vitamin E 180 MG (400 UNITS) capsule Take 400 Units by mouth daily.     zinc  gluconate 50 MG tablet Take 1 tablet (50 mg total) by mouth daily.     No current facility-administered medications for this visit.    PHYSICAL EXAMINATION: ECOG PERFORMANCE STATUS: 1 - Symptomatic but completely ambulatory  Vitals:   02/01/24 1035  BP: 113/79  Pulse: 92  Resp: 16  Temp: 97.6 F (36.4 C)  SpO2: 100%   Filed Weights   02/01/24 1035  Weight: 233 lb 14.4 oz (106.1 kg)    Physical Exam   (exam performed  in the presence of a chaperone)  LABORATORY DATA:  I have reviewed the data as listed    Latest Ref Rng & Units 03/03/2022    9:24 AM 01/15/2021    2:42 PM 08/24/2017    3:39 PM  CMP  Glucose 70 - 99 mg/dL 83  90  82   BUN 6 - 23 mg/dL 13  12  12    Creatinine 0.40 - 1.20 mg/dL 9.21  9.06  9.30   Sodium 135 - 145 mEq/L 140  143  139   Potassium 3.5 - 5.1 mEq/L 3.8  4.2  4.1   Chloride 96 - 112 mEq/L 106  108  103   CO2 19 - 32 mEq/L 29  28  29    Calcium 8.4 - 10.5 mg/dL 9.4  9.8  9.7   Total Protein 6.0 - 8.3 g/dL 6.6  6.8  7.0   Total Bilirubin 0.2 - 1.2 mg/dL 0.4  0.3  0.4   Alkaline Phos 39 - 117 U/L 65  62  73   AST 0 - 37 U/L 12  13  23    ALT 0 - 35 U/L 9  10  13      Lab Results  Component Value Date   WBC 4.5 03/03/2022   HGB 13.2 03/03/2022   HCT 40.5 03/03/2022   MCV 87.9 03/03/2022   PLT 322.0 03/03/2022   NEUTROABS 1.9 01/15/2021    ASSESSMENT & PLAN:  Breast cancer of upper-inner quadrant of left female breast (HCC) 10/09/2015: Left lumpectomy: Invasive lobular carcinoma grade 1, 0.9 cm, ALH, margins negative, 0/3 lymph nodes negative, T1bN0 stage IA, ER 80%, PR 95%, HER-2 negative ratio 0.97, Ki-67 10% Oncotype DX score 21, 14% risk of recurrence intermediate risk   Adjuvant radiation therapy from 11/21/2015 to 01/10/2016   Current treatment: Antiestrogen therapy with anastrozole  1 mg by mouth daily  started October 2017 quit 2020 when she lost her insurance. Resumed it 01/15/2021 at 1 tablet every other day.     Anastrozole  toxicities: 1. Intermittent hot flashes 2. muscle aches and pains     Breast cancer surveillance 1.  mammogram 12/09/2023: No evidence of malignancy breast density category C 2.  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  She is having a remodel event coming up and is very excited Return to clinic in 1 year for follow-up   Assessment & Plan Breast cancer of upper-inner quadrant of left  breast, status post-treatment On Anastrozole  1 mg every other night. Recent mammogram showed no concerning findings. - Continue Anastrozole  1 mg orally every other night. - Ensure no further refills of Anastrozole  until necessary. - Schedule bone density scan due to postmenopausal status and prolonged Anastrozole  use.  Essential  hypertension Blood pressure actively monitored. Recent readings within normal range. - Continue monitoring blood pressure multiple times daily. - Continue magnesium glycinate as needed for elevated blood pressure readings.      No orders of the defined types were placed in this encounter.  The patient has a good understanding of the overall plan. she agrees with it. she will call with any problems that may develop before the next visit here. Total time spent: 30 mins including face to face time and time spent for planning, charting and co-ordination of care   Naomi MARLA Chad, MD 02/01/24

## 2024-02-01 NOTE — Assessment & Plan Note (Signed)
 Left lumpectomy: Invasive lobular carcinoma grade 1, 0.9 cm, ALH, margins negative, 0/3 lymph nodes negative, T1bN0 stage IA, ER 80%, PR 95%, HER-2 negative ratio 0.97, Ki-67 10% Oncotype DX score 21, 14% risk of recurrence intermediate risk   Adjuvant radiation therapy from 11/21/2015 to 01/10/2016   Current treatment: Antiestrogen therapy with anastrozole  1 mg by mouth daily  started October 2017 quit 2020 when she lost her insurance. Resumed it 01/15/2021 at 1 tablet every other day.     Anastrozole  toxicities: 1. Intermittent hot flashes 2. muscle aches and pains     Breast cancer surveillance 1.  mammogram 12/09/2023: No evidence of malignancy breast density category C 2.  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  She is very excited about that. Return to clinic in 1 year for follow-up

## 2024-02-02 ENCOUNTER — Other Ambulatory Visit: Payer: Self-pay | Admitting: Internal Medicine

## 2024-02-08 DIAGNOSIS — D499 Neoplasm of unspecified behavior of unspecified site: Secondary | ICD-10-CM | POA: Diagnosis not present

## 2024-02-08 DIAGNOSIS — L728 Other follicular cysts of the skin and subcutaneous tissue: Secondary | ICD-10-CM | POA: Diagnosis not present

## 2024-03-07 ENCOUNTER — Ambulatory Visit (INDEPENDENT_AMBULATORY_CARE_PROVIDER_SITE_OTHER): Admitting: Internal Medicine

## 2024-03-07 ENCOUNTER — Encounter: Payer: Self-pay | Admitting: Internal Medicine

## 2024-03-07 ENCOUNTER — Ambulatory Visit

## 2024-03-07 VITALS — BP 124/70 | HR 73 | Temp 97.3°F | Ht 65.0 in | Wt 236.0 lb

## 2024-03-07 DIAGNOSIS — C50212 Malignant neoplasm of upper-inner quadrant of left female breast: Secondary | ICD-10-CM | POA: Diagnosis not present

## 2024-03-07 DIAGNOSIS — I10 Essential (primary) hypertension: Secondary | ICD-10-CM | POA: Diagnosis not present

## 2024-03-07 DIAGNOSIS — Z0000 Encounter for general adult medical examination without abnormal findings: Secondary | ICD-10-CM

## 2024-03-07 DIAGNOSIS — Z23 Encounter for immunization: Secondary | ICD-10-CM

## 2024-03-07 DIAGNOSIS — Z170 Estrogen receptor positive status [ER+]: Secondary | ICD-10-CM

## 2024-03-07 NOTE — Progress Notes (Signed)
" ° °  Subjective:   Patient ID: Maria Allen, female    DOB: [birth year], 63 y.o.   MRN: [id number]  The patient is here for physical. Pertinent topics discussed: Discussed the use of AI scribe software for clinical note transcription with the patient, who gave verbal consent to proceed.  History of Present Illness Maria Allen is a 63 year old female who presents for a routine follow-up visit.  She experiences joint and muscle stiffness, particularly in the mornings, which has been occurring for the past six months. The stiffness requires her to take an extra minute to get moving and is alleviated by a warm shower. She attributes some of the stiffness to aging and mentions that it is not severe enough to require double dosing on pain medication. She manages joint and muscle achiness with glucosamine supplements.  She continues to take Arimidex  (anastrozole ) every other day due to a previous insurance lapse that extended her treatment duration. She has a significant supply of the medication remaining and plans to continue until it runs out.  PMH, [facility], social history reviewed and updated  Review of Systems  Constitutional: Negative.   HENT: Negative.    Eyes: Negative.   Respiratory:  Negative for cough, chest tightness and shortness of breath.   Cardiovascular:  Negative for chest pain, palpitations and leg swelling.  Gastrointestinal:  Negative for abdominal distention, abdominal pain, constipation, diarrhea, nausea and vomiting.  Musculoskeletal: Negative.   Skin: Negative.   Neurological: Negative.   Psychiatric/Behavioral: Negative.      Objective:  Physical Exam Constitutional:      Appearance: She is well-developed.  HENT:     Head: Normocephalic and atraumatic.  Cardiovascular:     Rate and Rhythm: Normal rate and regular rhythm.  Pulmonary:     Effort: Pulmonary effort is normal. No respiratory distress.     Breath sounds: Normal breath sounds. No wheezing or rales.   Abdominal:     General: Bowel sounds are normal. There is no distension.     Palpations: Abdomen is soft.     Tenderness: There is no abdominal tenderness.  Musculoskeletal:     Cervical back: Normal range of motion.  Skin:    General: Skin is warm and dry.  Neurological:     Mental Status: She is alert and oriented to person, place, and time.     Coordination: Coordination normal.     Vitals:   03/07/24 1409  BP: 124/70  Pulse: 73  Temp: (!) 97.3 F (36.3 C)  TempSrc: Temporal  SpO2: 97%  Weight: 236 lb (107 kg)  Height: 5' 5 (1.651 m)    Assessment & Plan:  Flu shot and prevnar 20 given at visit "

## 2024-03-11 NOTE — Assessment & Plan Note (Signed)
 BMi 39 and complicated by hypertension. Counseled about diet and exercise.

## 2024-03-11 NOTE — Assessment & Plan Note (Signed)
 Flu shot given. Pneumonia given. Shingrix  complete. Tetanus up to date. Colonoscopy up to date. Mammogram up to date, pap smear up to date. Counseled about sun safety and mole surveillance. Counseled about the dangers of distracted driving. Given 10 year screening recommendations.

## 2024-03-11 NOTE — Assessment & Plan Note (Signed)
 Still taking arimidex  for now every other day. Continue and continue mammograms.

## 2024-03-11 NOTE — Assessment & Plan Note (Signed)
 Checking CMP and adjust as needed. Adjust regimen as needed.

## 2024-09-20 ENCOUNTER — Other Ambulatory Visit (HOSPITAL_BASED_OUTPATIENT_CLINIC_OR_DEPARTMENT_OTHER)

## 2025-02-06 ENCOUNTER — Ambulatory Visit: Admitting: Hematology and Oncology
